# Patient Record
Sex: Female | Born: 1960 | Race: White | Hispanic: No | Smoking: Never smoker
Health system: Southern US, Community
[De-identification: ages and names within clinical notes are randomized; demographics above are authoritative.]

## PROBLEM LIST (undated history)

## (undated) DIAGNOSIS — Z9889 Other specified postprocedural states: Secondary | ICD-10-CM

## (undated) DIAGNOSIS — R112 Nausea with vomiting, unspecified: Secondary | ICD-10-CM

## (undated) DIAGNOSIS — R002 Palpitations: Secondary | ICD-10-CM

## (undated) DIAGNOSIS — I493 Ventricular premature depolarization: Secondary | ICD-10-CM

---

## 1988-05-12 HISTORY — PX: LEEP: SHX91

## 2002-03-15 ENCOUNTER — Other Ambulatory Visit: Admission: RE | Admit: 2002-03-15 | Discharge: 2002-03-15 | Payer: Self-pay | Admitting: Obstetrics and Gynecology

## 2003-03-17 ENCOUNTER — Other Ambulatory Visit: Admission: RE | Admit: 2003-03-17 | Discharge: 2003-03-17 | Payer: Self-pay | Admitting: Obstetrics and Gynecology

## 2004-03-27 ENCOUNTER — Other Ambulatory Visit: Admission: RE | Admit: 2004-03-27 | Discharge: 2004-03-27 | Payer: Self-pay | Admitting: Obstetrics and Gynecology

## 2004-10-31 ENCOUNTER — Other Ambulatory Visit: Admission: RE | Admit: 2004-10-31 | Discharge: 2004-10-31 | Payer: Self-pay | Admitting: Obstetrics and Gynecology

## 2005-03-28 ENCOUNTER — Other Ambulatory Visit: Admission: RE | Admit: 2005-03-28 | Discharge: 2005-03-28 | Payer: Self-pay | Admitting: Obstetrics and Gynecology

## 2010-05-12 HISTORY — PX: CHOLECYSTECTOMY: SHX55

## 2012-09-13 ENCOUNTER — Other Ambulatory Visit: Payer: Self-pay | Admitting: Obstetrics and Gynecology

## 2012-09-13 DIAGNOSIS — R928 Other abnormal and inconclusive findings on diagnostic imaging of breast: Secondary | ICD-10-CM

## 2012-09-22 ENCOUNTER — Ambulatory Visit
Admission: RE | Admit: 2012-09-22 | Discharge: 2012-09-22 | Disposition: A | Discharge status: Home / Self Care | Payer: BC Managed Care – PPO | Source: Ambulatory Visit | Attending: Obstetrics and Gynecology | Admitting: Obstetrics and Gynecology

## 2012-09-22 DIAGNOSIS — R928 Other abnormal and inconclusive findings on diagnostic imaging of breast: Secondary | ICD-10-CM

## 2014-11-10 ENCOUNTER — Other Ambulatory Visit: Payer: Self-pay | Admitting: Obstetrics and Gynecology

## 2014-11-10 DIAGNOSIS — R928 Other abnormal and inconclusive findings on diagnostic imaging of breast: Secondary | ICD-10-CM

## 2014-11-21 ENCOUNTER — Ambulatory Visit
Admission: RE | Admit: 2014-11-21 | Discharge: 2014-11-21 | Disposition: A | Discharge status: Home / Self Care | Payer: BLUE CROSS/BLUE SHIELD | Source: Ambulatory Visit | Attending: Obstetrics and Gynecology | Admitting: Obstetrics and Gynecology

## 2014-11-21 DIAGNOSIS — R928 Other abnormal and inconclusive findings on diagnostic imaging of breast: Secondary | ICD-10-CM

## 2015-09-05 DIAGNOSIS — D2262 Melanocytic nevi of left upper limb, including shoulder: Secondary | ICD-10-CM | POA: Diagnosis not present

## 2015-09-05 DIAGNOSIS — D2272 Melanocytic nevi of left lower limb, including hip: Secondary | ICD-10-CM | POA: Diagnosis not present

## 2015-09-05 DIAGNOSIS — D2239 Melanocytic nevi of other parts of face: Secondary | ICD-10-CM | POA: Diagnosis not present

## 2015-09-05 DIAGNOSIS — D2261 Melanocytic nevi of right upper limb, including shoulder: Secondary | ICD-10-CM | POA: Diagnosis not present

## 2015-09-05 DIAGNOSIS — D485 Neoplasm of uncertain behavior of skin: Secondary | ICD-10-CM | POA: Diagnosis not present

## 2015-09-05 DIAGNOSIS — Z85828 Personal history of other malignant neoplasm of skin: Secondary | ICD-10-CM | POA: Diagnosis not present

## 2015-10-10 DIAGNOSIS — Z01419 Encounter for gynecological examination (general) (routine) without abnormal findings: Secondary | ICD-10-CM | POA: Diagnosis not present

## 2015-10-10 DIAGNOSIS — N950 Postmenopausal bleeding: Secondary | ICD-10-CM | POA: Diagnosis not present

## 2016-01-23 DIAGNOSIS — Z6821 Body mass index (BMI) 21.0-21.9, adult: Secondary | ICD-10-CM | POA: Diagnosis not present

## 2016-01-23 DIAGNOSIS — N83209 Unspecified ovarian cyst, unspecified side: Secondary | ICD-10-CM | POA: Diagnosis not present

## 2016-01-23 DIAGNOSIS — Z1231 Encounter for screening mammogram for malignant neoplasm of breast: Secondary | ICD-10-CM | POA: Diagnosis not present

## 2016-01-23 DIAGNOSIS — D259 Leiomyoma of uterus, unspecified: Secondary | ICD-10-CM | POA: Diagnosis not present

## 2016-01-23 DIAGNOSIS — Z01419 Encounter for gynecological examination (general) (routine) without abnormal findings: Secondary | ICD-10-CM | POA: Diagnosis not present

## 2016-02-13 DIAGNOSIS — Z6825 Body mass index (BMI) 25.0-25.9, adult: Secondary | ICD-10-CM | POA: Diagnosis not present

## 2016-02-13 DIAGNOSIS — R002 Palpitations: Secondary | ICD-10-CM | POA: Diagnosis not present

## 2016-02-13 DIAGNOSIS — I493 Ventricular premature depolarization: Secondary | ICD-10-CM | POA: Diagnosis not present

## 2016-03-11 DIAGNOSIS — H524 Presbyopia: Secondary | ICD-10-CM | POA: Diagnosis not present

## 2016-10-16 DIAGNOSIS — Z6826 Body mass index (BMI) 26.0-26.9, adult: Secondary | ICD-10-CM | POA: Diagnosis not present

## 2016-10-16 DIAGNOSIS — F419 Anxiety disorder, unspecified: Secondary | ICD-10-CM | POA: Diagnosis not present

## 2016-10-16 DIAGNOSIS — E785 Hyperlipidemia, unspecified: Secondary | ICD-10-CM | POA: Diagnosis not present

## 2016-11-18 DIAGNOSIS — D224 Melanocytic nevi of scalp and neck: Secondary | ICD-10-CM | POA: Diagnosis not present

## 2016-11-18 DIAGNOSIS — Z85828 Personal history of other malignant neoplasm of skin: Secondary | ICD-10-CM | POA: Diagnosis not present

## 2016-11-18 DIAGNOSIS — L814 Other melanin hyperpigmentation: Secondary | ICD-10-CM | POA: Diagnosis not present

## 2016-11-18 DIAGNOSIS — D2261 Melanocytic nevi of right upper limb, including shoulder: Secondary | ICD-10-CM | POA: Diagnosis not present

## 2017-02-04 DIAGNOSIS — Z124 Encounter for screening for malignant neoplasm of cervix: Secondary | ICD-10-CM | POA: Diagnosis not present

## 2017-02-04 DIAGNOSIS — Z01419 Encounter for gynecological examination (general) (routine) without abnormal findings: Secondary | ICD-10-CM | POA: Diagnosis not present

## 2017-02-04 DIAGNOSIS — Z6821 Body mass index (BMI) 21.0-21.9, adult: Secondary | ICD-10-CM | POA: Diagnosis not present

## 2017-02-04 DIAGNOSIS — Z1231 Encounter for screening mammogram for malignant neoplasm of breast: Secondary | ICD-10-CM | POA: Diagnosis not present

## 2017-02-04 DIAGNOSIS — D259 Leiomyoma of uterus, unspecified: Secondary | ICD-10-CM | POA: Diagnosis not present

## 2017-03-24 DIAGNOSIS — H5213 Myopia, bilateral: Secondary | ICD-10-CM | POA: Diagnosis not present

## 2017-03-25 DIAGNOSIS — Z87891 Personal history of nicotine dependence: Secondary | ICD-10-CM | POA: Diagnosis not present

## 2017-03-25 DIAGNOSIS — R002 Palpitations: Secondary | ICD-10-CM | POA: Diagnosis not present

## 2017-03-25 DIAGNOSIS — I493 Ventricular premature depolarization: Secondary | ICD-10-CM | POA: Diagnosis not present

## 2017-03-27 ENCOUNTER — Other Ambulatory Visit: Payer: Self-pay

## 2018-03-04 DIAGNOSIS — N83201 Unspecified ovarian cyst, right side: Secondary | ICD-10-CM | POA: Diagnosis not present

## 2018-03-04 DIAGNOSIS — Z1231 Encounter for screening mammogram for malignant neoplasm of breast: Secondary | ICD-10-CM | POA: Diagnosis not present

## 2018-03-04 DIAGNOSIS — Z6824 Body mass index (BMI) 24.0-24.9, adult: Secondary | ICD-10-CM | POA: Diagnosis not present

## 2018-03-04 DIAGNOSIS — D259 Leiomyoma of uterus, unspecified: Secondary | ICD-10-CM | POA: Diagnosis not present

## 2018-03-04 DIAGNOSIS — Z113 Encounter for screening for infections with a predominantly sexual mode of transmission: Secondary | ICD-10-CM | POA: Diagnosis not present

## 2018-03-04 DIAGNOSIS — Z01419 Encounter for gynecological examination (general) (routine) without abnormal findings: Secondary | ICD-10-CM | POA: Diagnosis not present

## 2018-04-05 DIAGNOSIS — I493 Ventricular premature depolarization: Secondary | ICD-10-CM | POA: Diagnosis not present

## 2018-04-05 DIAGNOSIS — R002 Palpitations: Secondary | ICD-10-CM | POA: Diagnosis not present

## 2018-06-03 DIAGNOSIS — F419 Anxiety disorder, unspecified: Secondary | ICD-10-CM | POA: Diagnosis not present

## 2018-06-03 DIAGNOSIS — E785 Hyperlipidemia, unspecified: Secondary | ICD-10-CM | POA: Diagnosis not present

## 2018-06-03 DIAGNOSIS — Z23 Encounter for immunization: Secondary | ICD-10-CM | POA: Diagnosis not present

## 2018-06-03 DIAGNOSIS — M25531 Pain in right wrist: Secondary | ICD-10-CM | POA: Diagnosis not present

## 2018-06-07 DIAGNOSIS — M654 Radial styloid tenosynovitis [de Quervain]: Secondary | ICD-10-CM | POA: Diagnosis not present

## 2018-06-08 DIAGNOSIS — Z0000 Encounter for general adult medical examination without abnormal findings: Secondary | ICD-10-CM | POA: Diagnosis not present

## 2018-07-20 DIAGNOSIS — M65332 Trigger finger, left middle finger: Secondary | ICD-10-CM | POA: Diagnosis not present

## 2019-04-21 NOTE — Patient Instructions (Addendum)
DUE TO COVID-19 ONLY ONE VISITOR IS ALLOWED IN WAITING ROOM (VISITOR WILL HAVE A TEMPERATURE CHECK ON ARRIVAL AND MUST WEAR A FACE MASK THE ENTIRE TIME.)  ONCE YOU ARE ADMITTED TO YOUR PRIVATE ROOM, THE SAME ONE VISITOR IS ALLOWED TO VISIT DURING VISITING HOURS ONLY.  Your COVID swab testing is scheduled for 04/22/2019 at  2:20PM , You must self quarantine after your testing per handout given to you at the testing site.  ([place] up testing enter pre-surgical testing line)    Your procedure is scheduled on: 04/26/2019  Report to: [facility] AT: 10:00  A. M.   Call this number if you have problems the morning of surgery:[phone].   OUR ADDRESS IS [place].  WE ARE LOCATED IN THE [facility].                                     REMEMBER: DO NOT EAT FOOD OR DRINK LIQUIDS AFTER MIDNIGHT .    BRUSH YOUR TEETH THE MORNING OF SURGERY.  TAKE THESE MEDICATIONS MORNING OF SURGERY WITH A SIP OF WATER:  Carvedilol, Atorvastatin , Xyzal  , Flonase nasal spray if needed  DO NOT WEAR JEWERLY, MAKE UP, OR NAIL POLISH.  DO NOT WEAR LOTIONS, POWDERS, PERFUMES/COLOGNE OR DEODORANT.  DO NOT SHAVE FOR 24 HOURS PRIOR TO DAY OF SURGERY.    CONTACTS, GLASSES, OR DENTURES MAY NOT BE WORN TO SURGERY.                                    [facility] IS NOT RESPONSIBLE  FOR ANY BELONGINGS.           BRING ALL PRESCRIPTION MEDICATIONS WITH YOU THE DAY OF SURGERY IN ORIGINAL CONTAINERS                                                                    .[facility] - Preparing for Surgery Before surgery, you can play an important role.  Because skin is not sterile, your skin needs to be as free of germs as possible.  You can reduce the number of germs on your skin by washing with CHG (chlorahexidine gluconate) soap before surgery.  CHG is an antiseptic cleaner which kills germs and bonds with the skin to  continue killing germs even after washing. Please DO NOT use if you have an allergy to CHG or antibacterial soaps.  If your skin becomes reddened/irritated stop using the CHG and inform your nurse when you arrive at Short Stay. Do not shave (including legs and underarms) for at least 48 hours prior to the first CHG shower.  You may shave your face/neck. Please follow these instructions carefully:  1.  Shower with CHG Soap the night before surgery and the  morning of Surgery.  2.  If you choose to wash your hair, wash your hair first as usual with your  normal  shampoo.  3.  After you shampoo, rinse your hair and body thoroughly to remove the  shampoo.  4.  Use CHG as you would any other liquid soap.  You can apply chg directly  to the skin and wash                       Gently with a scrungie or clean washcloth.  5.  Apply the CHG Soap to your body ONLY FROM THE NECK DOWN.   Do not use on face/ open                           Wound or open sores. Avoid contact with eyes, ears mouth and genitals (private parts).                       Wash face,  Genitals (private parts) with your normal soap.             6.  Wash thoroughly, paying special attention to the area where your surgery  will be performed.  7.  Thoroughly rinse your body with warm water from the neck down.  8.  DO NOT shower/wash with your normal soap after using and rinsing off  the CHG Soap.                9.  Pat yourself dry with a clean towel.            10.  Wear clean pajamas.            11.  Place clean sheets on your bed the night of your first shower and do not  sleep with pets. Day of Surgery : Do not apply any lotions/deodorants the morning of surgery.  Please wear clean clothes to the hospital/surgery center.  FAILURE TO FOLLOW THESE INSTRUCTIONS MAY RESULT IN THE CANCELLATION OF YOUR SURGERY PATIENT SIGNATURE_________________________________  NURSE  SIGNATURE__________________________________  ________________________________________________________________________   [facility] - Preparing for Surgery Before surgery, you can play an important role.  Because skin is not sterile, your skin needs to be as free of germs as possible.  You can reduce the number of germs on your skin by washing with CHG (chlorahexidine gluconate) soap before surgery.  CHG is an antiseptic cleaner which kills germs and bonds with the skin to continue killing germs even after washing. Please DO NOT use if you have an allergy to CHG or antibacterial soaps.  If your skin becomes reddened/irritated stop using the CHG and inform your nurse when you arrive at Short Stay. Do not shave (including legs and underarms) for at least 48 hours prior to the first CHG shower.  You may shave your face/neck. Please follow these instructions carefully:  1.  Shower with CHG Soap the night before surgery and the  morning of Surgery.  2.  If you choose to wash your hair, wash your hair first as usual with your  normal  shampoo.  3.  After you shampoo, rinse your hair and body thoroughly to remove the  shampoo.                           4.  Use CHG as you would any other liquid soap.  You can apply chg directly  to the skin and wash                       Gently with a scrungie or clean washcloth.  5.  Apply  the CHG Soap to your body ONLY FROM THE NECK DOWN.   Do not use on face/ open                           Wound or open sores. Avoid contact with eyes, ears mouth and genitals (private parts).                       Wash face,  Genitals (private parts) with your normal soap.             6.  Wash thoroughly, paying special attention to the area where your surgery  will be performed.  7.  Thoroughly rinse your body with warm water from the neck down.  8.  DO NOT shower/wash with your normal soap after using and rinsing off  the CHG Soap.                9.  Pat yourself dry with a clean  towel.            10.  Wear clean pajamas.            11.  Place clean sheets on your bed the night of your first shower and do not  sleep with pets. Day of Surgery : Do not apply any lotions/deodorants the morning of surgery.  Please wear clean clothes to the hospital/surgery center.  FAILURE TO FOLLOW THESE INSTRUCTIONS MAY RESULT IN THE CANCELLATION OF YOUR SURGERY PATIENT SIGNATURE_________________________________  NURSE SIGNATURE__________________________________  ________________________________________________________________________

## 2019-04-21 NOTE — Progress Notes (Signed)
Can you please place orders on Epic,pt. Has her PST appointment on 04/22/2019. Thank you

## 2019-04-22 ENCOUNTER — Encounter (HOSPITAL_COMMUNITY)
Admission: RE | Admit: 2019-04-22 | Discharge: 2019-04-22 | Disposition: A | Discharge status: Home / Self Care | Payer: Managed Care, Other (non HMO) | Source: Ambulatory Visit | Attending: Obstetrics | Admitting: Obstetrics

## 2019-04-22 ENCOUNTER — Other Ambulatory Visit: Payer: Self-pay

## 2019-04-22 ENCOUNTER — Encounter (HOSPITAL_COMMUNITY): Payer: Self-pay

## 2019-04-22 ENCOUNTER — Other Ambulatory Visit (HOSPITAL_COMMUNITY)
Admission: RE | Admit: 2019-04-22 | Discharge: 2019-04-22 | Disposition: A | Discharge status: Home / Self Care | Payer: Managed Care, Other (non HMO) | Source: Ambulatory Visit | Attending: Obstetrics | Admitting: Obstetrics

## 2019-04-22 DIAGNOSIS — Z20828 Contact with and (suspected) exposure to other viral communicable diseases: Secondary | ICD-10-CM | POA: Diagnosis not present

## 2019-04-22 DIAGNOSIS — D259 Leiomyoma of uterus, unspecified: Secondary | ICD-10-CM | POA: Insufficient documentation

## 2019-04-22 DIAGNOSIS — Z01818 Encounter for other preprocedural examination: Secondary | ICD-10-CM | POA: Diagnosis not present

## 2019-04-22 DIAGNOSIS — N83201 Unspecified ovarian cyst, right side: Secondary | ICD-10-CM | POA: Insufficient documentation

## 2019-04-22 DIAGNOSIS — R001 Bradycardia, unspecified: Secondary | ICD-10-CM | POA: Insufficient documentation

## 2019-04-22 DIAGNOSIS — R9431 Abnormal electrocardiogram [ECG] [EKG]: Secondary | ICD-10-CM | POA: Insufficient documentation

## 2019-04-22 HISTORY — DX: Other specified postprocedural states: Z98.890

## 2019-04-22 HISTORY — DX: Ventricular premature depolarization: I49.3

## 2019-04-22 HISTORY — DX: Nausea with vomiting, unspecified: R11.2

## 2019-04-22 HISTORY — DX: Palpitations: R00.2

## 2019-04-22 LAB — CBC
HCT: 43.5 % (ref 36.0–46.0)
Hemoglobin: 14.4 g/dL (ref 12.0–15.0)
MCH: 31.3 pg (ref 26.0–34.0)
MCHC: 33.1 g/dL (ref 30.0–36.0)
MCV: 94.6 fL (ref 80.0–100.0)
Platelets: 226 10*3/uL (ref 150–400)
RBC: 4.6 MIL/uL (ref 3.87–5.11)
RDW: 11.7 % (ref 11.5–15.5)
WBC: 6.9 10*3/uL (ref 4.0–10.5)
nRBC: 0 % (ref 0.0–0.2)

## 2019-04-22 LAB — BASIC METABOLIC PANEL
Anion gap: 10 (ref 5–15)
BUN: 10 mg/dL (ref 6–20)
CO2: 25 mmol/L (ref 22–32)
Calcium: 9.7 mg/dL (ref 8.9–10.3)
Chloride: 108 mmol/L (ref 98–111)
Creatinine, Ser: 0.61 mg/dL (ref 0.44–1.00)
GFR calc Af Amer: >60 mL/min (ref >60)
GFR calc non Af Amer: >60 mL/min (ref >60)
Glucose, Bld: 111 mg/dL — ABNORMAL HIGH (ref 70–99)
Potassium: 3.9 mmol/L (ref 3.5–5.1)
Sodium: 143 mmol/L (ref 135–145)

## 2019-04-22 NOTE — Progress Notes (Signed)
PCP - Nicoletta Dress, MD Cardiologist - Mar Daring, MD lov/cardiac clearance 03-25-2019 [facility]    Chest x-ray -  EKG - 04-22-2019 epic  Stress Test -  ECHO -  Cardiac Cath -   Sleep Study -  CPAP -   Fasting Blood Sugar -  Checks Blood Sugar _____ times a day  Blood Thinner Instructions: Aspirin Instructions: Last Dose:  Anesthesia review:    Hx of palpitations and PVCS. Patient reports good control of palpitations with her carvedilol with palpitations occurring very infrequently and usually only under circumstances of increased stress. Denies any associated chest pain or sob. Vitals wdl today. ekg obtained today . she follows up with cardiology annually. lov Dr Otho Perl 03-25-2019 for pre-op exam    Patient denies shortness of breath, fever, cough and chest pain at PAT appointment   Patient verbalized understanding of instructions that were given to them at the PAT appointment. Patient was also instructed that they will need to review over the PAT instructions again at home before surgery.

## 2019-04-23 LAB — NOVEL CORONAVIRUS, NAA (HOSP ORDER, SEND-OUT TO REF LAB; TAT 18-24 HRS): SARS-CoV-2, NAA: NOT DETECTED

## 2019-04-25 NOTE — Anesthesia Preprocedure Evaluation (Addendum)
Anesthesia Evaluation  Patient identified by MRN, date of birth, ID band Patient awake    Reviewed: Allergy & Precautions, NPO status , Patient's Chart, lab work & pertinent test results, reviewed documented beta blocker date and time   History of Anesthesia Complications (+) PONV and history of anesthetic complications  Airway Mallampati: II  TM Distance: >3 FB Neck ROM: Full    Dental no notable dental hx.    Pulmonary neg pulmonary ROS,    Pulmonary exam normal breath sounds clear to auscultation       Cardiovascular Normal cardiovascular exam+ dysrhythmias  Rhythm:Regular Rate:Normal  Palpitations/PVCs since 2011, cleared by cardiology for this procedure- on coreg   Neuro/Psych PSYCHIATRIC DISORDERS Anxiety negative neurological ROS     GI/Hepatic negative GI ROS, Neg liver ROS,   Endo/Other  negative endocrine ROS  Renal/GU negative Renal ROS   Uterine fibroid, ovarian cyst    Musculoskeletal negative musculoskeletal ROS (+)   Abdominal   Peds  Hematology negative hematology ROS (+)   Anesthesia Other Findings HLD  Reproductive/Obstetrics negative OB ROS                            Anesthesia Physical Anesthesia Plan  ASA: II  Anesthesia Plan: General   Post-op Pain Management:    Induction: Intravenous  PONV Risk Score and Plan: 4 or greater and Ondansetron, Dexamethasone, Propofol infusion, Midazolam, Scopolamine patch - Pre-op and Metaclopromide  Airway Management Planned: Oral ETT  Additional Equipment: None  Intra-op Plan:   Post-operative Plan: Extubation in OR  Informed Consent: I have reviewed the patients History and Physical, chart, labs and discussed the procedure including the risks, benefits and alternatives for the proposed anesthesia with the patient or authorized representative who has indicated his/her understanding and acceptance.     Dental  advisory given  Plan Discussed with: CRNA  Anesthesia Plan Comments:        Anesthesia Quick Evaluation

## 2019-04-26 ENCOUNTER — Encounter (HOSPITAL_BASED_OUTPATIENT_CLINIC_OR_DEPARTMENT_OTHER): Payer: Self-pay | Admitting: Obstetrics

## 2019-04-26 ENCOUNTER — Encounter (HOSPITAL_BASED_OUTPATIENT_CLINIC_OR_DEPARTMENT_OTHER)
Admission: RE | Disposition: A | Discharge status: Home / Self Care | Payer: Self-pay | Source: Home / Self Care | Attending: Obstetrics

## 2019-04-26 ENCOUNTER — Observation Stay (HOSPITAL_BASED_OUTPATIENT_CLINIC_OR_DEPARTMENT_OTHER): Payer: Managed Care, Other (non HMO) | Admitting: Anesthesiology

## 2019-04-26 ENCOUNTER — Other Ambulatory Visit: Payer: Self-pay

## 2019-04-26 ENCOUNTER — Observation Stay (HOSPITAL_BASED_OUTPATIENT_CLINIC_OR_DEPARTMENT_OTHER)
Admission: RE | Admit: 2019-04-26 | Discharge: 2019-04-27 | Disposition: A | Discharge status: Home / Self Care | Payer: Managed Care, Other (non HMO) | Attending: Obstetrics | Admitting: Obstetrics

## 2019-04-26 ENCOUNTER — Observation Stay (HOSPITAL_BASED_OUTPATIENT_CLINIC_OR_DEPARTMENT_OTHER): Payer: Managed Care, Other (non HMO) | Admitting: Physician Assistant

## 2019-04-26 DIAGNOSIS — D270 Benign neoplasm of right ovary: Principal | ICD-10-CM | POA: Insufficient documentation

## 2019-04-26 DIAGNOSIS — R102 Pelvic and perineal pain: Secondary | ICD-10-CM | POA: Diagnosis present

## 2019-04-26 DIAGNOSIS — D251 Intramural leiomyoma of uterus: Secondary | ICD-10-CM | POA: Insufficient documentation

## 2019-04-26 DIAGNOSIS — Z90710 Acquired absence of both cervix and uterus: Secondary | ICD-10-CM

## 2019-04-26 DIAGNOSIS — N838 Other noninflammatory disorders of ovary, fallopian tube and broad ligament: Secondary | ICD-10-CM | POA: Diagnosis not present

## 2019-04-26 DIAGNOSIS — N800 Endometriosis of uterus: Secondary | ICD-10-CM | POA: Diagnosis not present

## 2019-04-26 DIAGNOSIS — Z79899 Other long term (current) drug therapy: Secondary | ICD-10-CM | POA: Insufficient documentation

## 2019-04-26 DIAGNOSIS — N83209 Unspecified ovarian cyst, unspecified side: Secondary | ICD-10-CM | POA: Diagnosis present

## 2019-04-26 HISTORY — PX: LAPAROSCOPIC VAGINAL HYSTERECTOMY WITH SALPINGO OOPHORECTOMY: SHX6681

## 2019-04-26 LAB — TYPE AND SCREEN
ABO/RH(D): A POS
Antibody Screen: NEGATIVE

## 2019-04-26 LAB — ABO/RH: ABO/RH(D): A POS

## 2019-04-26 SURGERY — HYSTERECTOMY, VAGINAL, LAPAROSCOPY-ASSISTED, WITH SALPINGO-OOPHORECTOMY
Anesthesia: General | Site: Abdomen

## 2019-04-26 MED ORDER — OXYCODONE HCL 5 MG PO TABS
5.0000 mg | ORAL_TABLET | Freq: Once | ORAL | Status: DC | PRN
  Filled 2019-04-26: qty 1

## 2019-04-26 MED ORDER — ACETAMINOPHEN 500 MG PO TABS
1000.0000 mg | ORAL_TABLET | ORAL | Status: AC
  Administered 2019-04-26: 1000 mg via ORAL
  Filled 2019-04-26: qty 2

## 2019-04-26 MED ORDER — SCOPOLAMINE 1 MG/3DAYS TD PT72
MEDICATED_PATCH | TRANSDERMAL | Status: AC
  Filled 2019-04-26: qty 1

## 2019-04-26 MED ORDER — SODIUM CHLORIDE 0.9 % IR SOLN
Status: DC | PRN
  Administered 2019-04-26: 150 mL

## 2019-04-26 MED ORDER — LIDOCAINE 2% (20 MG/ML) 5 ML SYRINGE
INTRAMUSCULAR | Status: DC | PRN
  Administered 2019-04-26: 40 mg via INTRAVENOUS
  Administered 2019-04-26: 60 mg via INTRAVENOUS

## 2019-04-26 MED ORDER — MIDAZOLAM HCL 2 MG/2ML IJ SOLN
INTRAMUSCULAR | Status: DC | PRN
  Administered 2019-04-26: 2 mg via INTRAVENOUS

## 2019-04-26 MED ORDER — DEXAMETHASONE SODIUM PHOSPHATE 10 MG/ML IJ SOLN
INTRAMUSCULAR | Status: AC
  Filled 2019-04-26: qty 1

## 2019-04-26 MED ORDER — BUPIVACAINE HCL (PF) 0.25 % IJ SOLN
INTRAMUSCULAR | Status: DC | PRN
  Administered 2019-04-26: 8 mL

## 2019-04-26 MED ORDER — SCOPOLAMINE 1 MG/3DAYS TD PT72
1.0000 | MEDICATED_PATCH | TRANSDERMAL | Status: DC
  Administered 2019-04-26: 1.5 mg via TRANSDERMAL
  Filled 2019-04-26: qty 1

## 2019-04-26 MED ORDER — CEFAZOLIN SODIUM-DEXTROSE 2-4 GM/100ML-% IV SOLN
INTRAVENOUS | Status: AC
  Filled 2019-04-26: qty 100

## 2019-04-26 MED ORDER — FENTANYL CITRATE (PF) 250 MCG/5ML IJ SOLN
INTRAMUSCULAR | Status: AC
  Filled 2019-04-26: qty 5

## 2019-04-26 MED ORDER — ONDANSETRON HCL 4 MG/2ML IJ SOLN
4.0000 mg | Freq: Four times a day (QID) | INTRAMUSCULAR | Status: DC | PRN
  Filled 2019-04-26: qty 2

## 2019-04-26 MED ORDER — METOCLOPRAMIDE HCL 5 MG/ML IJ SOLN
INTRAMUSCULAR | Status: AC
  Filled 2019-04-26: qty 2

## 2019-04-26 MED ORDER — ACETAMINOPHEN 500 MG PO TABS
ORAL_TABLET | ORAL | Status: AC
  Filled 2019-04-26: qty 2

## 2019-04-26 MED ORDER — CEFAZOLIN SODIUM-DEXTROSE 2-4 GM/100ML-% IV SOLN
2.0000 g | INTRAVENOUS | Status: AC
  Administered 2019-04-26: 2 g via INTRAVENOUS
  Filled 2019-04-26: qty 100

## 2019-04-26 MED ORDER — LACTATED RINGERS IV SOLN
INTRAVENOUS | Status: DC
  Filled 2019-04-26: qty 1000

## 2019-04-26 MED ORDER — FENTANYL CITRATE (PF) 100 MCG/2ML IJ SOLN
INTRAMUSCULAR | Status: DC | PRN
  Administered 2019-04-26 (×5): 50 ug via INTRAVENOUS

## 2019-04-26 MED ORDER — OXYCODONE HCL 5 MG PO TABS
5.0000 mg | ORAL_TABLET | ORAL | Status: DC | PRN
  Filled 2019-04-26: qty 2

## 2019-04-26 MED ORDER — PROPOFOL 10 MG/ML IV BOLUS
INTRAVENOUS | Status: AC
  Filled 2019-04-26: qty 20

## 2019-04-26 MED ORDER — IBUPROFEN 800 MG PO TABS
800.0000 mg | ORAL_TABLET | Freq: Three times a day (TID) | ORAL | Status: DC
  Administered 2019-04-26 – 2019-04-27 (×2): 800 mg via ORAL
  Filled 2019-04-26: qty 1

## 2019-04-26 MED ORDER — MEPERIDINE HCL 25 MG/ML IJ SOLN
6.2500 mg | INTRAMUSCULAR | Status: DC | PRN
  Filled 2019-04-26: qty 1

## 2019-04-26 MED ORDER — ONDANSETRON HCL 4 MG/2ML IJ SOLN
INTRAMUSCULAR | Status: AC
  Filled 2019-04-26: qty 2

## 2019-04-26 MED ORDER — DEXAMETHASONE SODIUM PHOSPHATE 10 MG/ML IJ SOLN
INTRAMUSCULAR | Status: DC | PRN
  Administered 2019-04-26: 8 mg via INTRAVENOUS

## 2019-04-26 MED ORDER — GABAPENTIN 300 MG PO CAPS
ORAL_CAPSULE | ORAL | Status: AC
  Filled 2019-04-26: qty 1

## 2019-04-26 MED ORDER — ONDANSETRON HCL 4 MG PO TABS
4.0000 mg | ORAL_TABLET | Freq: Four times a day (QID) | ORAL | Status: DC | PRN
  Filled 2019-04-26: qty 1

## 2019-04-26 MED ORDER — GLYCOPYRROLATE 0.2 MG/ML IJ SOLN
INTRAMUSCULAR | Status: DC | PRN
  Administered 2019-04-26: .2 mg via INTRAVENOUS

## 2019-04-26 MED ORDER — GABAPENTIN 300 MG PO CAPS
300.0000 mg | ORAL_CAPSULE | ORAL | Status: AC
  Administered 2019-04-26: 11:00:00 300 mg via ORAL
  Filled 2019-04-26: qty 1

## 2019-04-26 MED ORDER — KETOROLAC TROMETHAMINE 30 MG/ML IJ SOLN
30.0000 mg | Freq: Once | INTRAMUSCULAR | Status: AC | PRN
  Filled 2019-04-26: qty 1

## 2019-04-26 MED ORDER — EPHEDRINE 5 MG/ML INJ
INTRAVENOUS | Status: AC
  Filled 2019-04-26: qty 10

## 2019-04-26 MED ORDER — PROPOFOL 10 MG/ML IV BOLUS
INTRAVENOUS | Status: DC | PRN
  Administered 2019-04-26: 150 mg via INTRAVENOUS
  Administered 2019-04-26: 30 mg via INTRAVENOUS

## 2019-04-26 MED ORDER — SODIUM CHLORIDE (PF) 0.9 % IJ SOLN
INTRAMUSCULAR | Status: DC | PRN
  Administered 2019-04-26: 100 mL

## 2019-04-26 MED ORDER — HYDROMORPHONE HCL 1 MG/ML IJ SOLN
0.2500 mg | INTRAMUSCULAR | Status: DC | PRN
  Filled 2019-04-26: qty 0.5

## 2019-04-26 MED ORDER — KETOROLAC TROMETHAMINE 30 MG/ML IJ SOLN
INTRAMUSCULAR | Status: AC
  Filled 2019-04-26: qty 1

## 2019-04-26 MED ORDER — PROMETHAZINE HCL 25 MG/ML IJ SOLN
6.2500 mg | INTRAMUSCULAR | Status: DC | PRN
  Filled 2019-04-26: qty 1

## 2019-04-26 MED ORDER — EPHEDRINE SULFATE-NACL 50-0.9 MG/10ML-% IV SOSY
PREFILLED_SYRINGE | INTRAVENOUS | Status: DC | PRN
  Administered 2019-04-26: 5 mg via INTRAVENOUS
  Administered 2019-04-26: 15 mg via INTRAVENOUS
  Administered 2019-04-26: 10 mg via INTRAVENOUS
  Administered 2019-04-26: 15 mg via INTRAVENOUS

## 2019-04-26 MED ORDER — ROCURONIUM BROMIDE 10 MG/ML (PF) SYRINGE
PREFILLED_SYRINGE | INTRAVENOUS | Status: AC
  Filled 2019-04-26: qty 10

## 2019-04-26 MED ORDER — LIDOCAINE 2% (20 MG/ML) 5 ML SYRINGE
INTRAMUSCULAR | Status: AC
  Filled 2019-04-26: qty 5

## 2019-04-26 MED ORDER — MENTHOL 3 MG MT LOZG
1.0000 | LOZENGE | OROMUCOSAL | Status: DC | PRN
  Filled 2019-04-26: qty 9

## 2019-04-26 MED ORDER — ACETAMINOPHEN 500 MG PO TABS
1000.0000 mg | ORAL_TABLET | Freq: Four times a day (QID) | ORAL | Status: DC
  Administered 2019-04-26 – 2019-04-27 (×3): 1000 mg via ORAL
  Filled 2019-04-26: qty 2

## 2019-04-26 MED ORDER — METOCLOPRAMIDE HCL 5 MG/ML IJ SOLN
INTRAMUSCULAR | Status: DC | PRN
  Administered 2019-04-26 (×2): 5 mg via INTRAVENOUS

## 2019-04-26 MED ORDER — KETOROLAC TROMETHAMINE 15 MG/ML IJ SOLN
15.0000 mg | INTRAMUSCULAR | Status: AC
  Administered 2019-04-26: 30 mg via INTRAVENOUS
  Filled 2019-04-26: qty 1

## 2019-04-26 MED ORDER — OXYCODONE HCL 5 MG/5ML PO SOLN
5.0000 mg | Freq: Once | ORAL | Status: DC | PRN
  Filled 2019-04-26: qty 5

## 2019-04-26 MED ORDER — VASOPRESSIN 20 UNIT/ML IV SOLN
INTRAVENOUS | Status: DC | PRN
  Administered 2019-04-26: 20 [IU] via SUBCUTANEOUS

## 2019-04-26 MED ORDER — ROCURONIUM BROMIDE 10 MG/ML (PF) SYRINGE
PREFILLED_SYRINGE | INTRAVENOUS | Status: DC | PRN
  Administered 2019-04-26 (×2): 10 mg via INTRAVENOUS
  Administered 2019-04-26: 50 mg via INTRAVENOUS
  Administered 2019-04-26 (×2): 10 mg via INTRAVENOUS

## 2019-04-26 MED ORDER — ONDANSETRON HCL 4 MG/2ML IJ SOLN
INTRAMUSCULAR | Status: DC | PRN
  Administered 2019-04-26 (×2): 4 mg via INTRAVENOUS

## 2019-04-26 MED ORDER — SUGAMMADEX SODIUM 200 MG/2ML IV SOLN
INTRAVENOUS | Status: DC | PRN
  Administered 2019-04-26: 200 mg via INTRAVENOUS

## 2019-04-26 MED ORDER — CARVEDILOL 3.125 MG PO TABS
3.1250 mg | ORAL_TABLET | Freq: Two times a day (BID) | ORAL | Status: DC
  Administered 2019-04-27: 08:00:00 3.125 mg via ORAL
  Filled 2019-04-26: qty 1

## 2019-04-26 MED ORDER — IBUPROFEN 800 MG PO TABS
ORAL_TABLET | ORAL | Status: AC
  Filled 2019-04-26: qty 1

## 2019-04-26 MED ORDER — GLYCOPYRROLATE PF 0.2 MG/ML IJ SOSY
PREFILLED_SYRINGE | INTRAMUSCULAR | Status: AC
  Filled 2019-04-26: qty 1

## 2019-04-26 MED ORDER — ACETAMINOPHEN 500 MG PO TABS
1000.0000 mg | ORAL_TABLET | Freq: Once | ORAL | Status: DC
  Filled 2019-04-26: qty 2

## 2019-04-26 MED ORDER — HYDROMORPHONE HCL 1 MG/ML IJ SOLN
INTRAMUSCULAR | Status: AC
  Filled 2019-04-26: qty 1

## 2019-04-26 MED ORDER — MIDAZOLAM HCL 2 MG/2ML IJ SOLN
INTRAMUSCULAR | Status: AC
  Filled 2019-04-26: qty 2

## 2019-04-26 SURGICAL SUPPLY — 58 items
ADH SKN CLS APL DERMABOND .7 (GAUZE/BANDAGES/DRESSINGS) ×1
BAG SPEC RTRVL LRG 6X4 10 (ENDOMECHANICALS) ×1
BLADE 10 SAFETY STRL DISP (BLADE) ×1 IMPLANT
CATH ROBINSON RED A/P 16FR (CATHETERS) ×1 IMPLANT
CONT SPEC 4OZ CLIKSEAL STRL BL (MISCELLANEOUS) ×1 IMPLANT
COVER BACK TABLE 60X90IN (DRAPES) ×2 IMPLANT
COVER MAYO STAND STRL (DRAPES) ×4 IMPLANT
COVER WAND RF STERILE (DRAPES) ×2 IMPLANT
DECANTER SPIKE VIAL GLASS SM (MISCELLANEOUS) ×4 IMPLANT
DERMABOND ADVANCED (GAUZE/BANDAGES/DRESSINGS) ×1
DERMABOND ADVANCED .7 DNX12 (GAUZE/BANDAGES/DRESSINGS) IMPLANT
DRSG OPSITE POSTOP 3X4 (GAUZE/BANDAGES/DRESSINGS) ×1 IMPLANT
DURAPREP 26ML APPLICATOR (WOUND CARE) ×2 IMPLANT
ELECT REM PT RETURN 9FT ADLT (ELECTROSURGICAL) ×2
ELECTRODE REM PT RTRN 9FT ADLT (ELECTROSURGICAL) ×1 IMPLANT
GAUZE 4X4 16PLY RFD (DISPOSABLE) ×2 IMPLANT
GLOVE BIOGEL PI IND STRL 6.5 (GLOVE) ×3 IMPLANT
GLOVE BIOGEL PI IND STRL 7.0 (GLOVE) ×2 IMPLANT
GLOVE BIOGEL PI INDICATOR 6.5 (GLOVE) ×3
GLOVE BIOGEL PI INDICATOR 7.0 (GLOVE) ×2
GLOVE ECLIPSE 6.0 STRL STRAW (GLOVE) ×4 IMPLANT
HOLDER FOLEY CATH W/STRAP (MISCELLANEOUS) ×1 IMPLANT
IV NS IRRIG 3000ML ARTHROMATIC (IV SOLUTION) ×1 IMPLANT
LIGASURE VESSEL 5MM BLUNT TIP (ELECTROSURGICAL) ×1 IMPLANT
MANIPULATOR ADVINCU DEL 2.5 PL (MISCELLANEOUS) ×1 IMPLANT
NS IRRIG 1000ML POUR BTL (IV SOLUTION) ×2 IMPLANT
PACK LAVH (CUSTOM PROCEDURE TRAY) ×2 IMPLANT
PACK ROBOTIC GOWN (GOWN DISPOSABLE) ×2 IMPLANT
PACK TRENDGUARD 450 HYBRID PRO (MISCELLANEOUS) IMPLANT
POUCH SPECIMEN RETRIEVAL 10MM (ENDOMECHANICALS) ×1 IMPLANT
PROTECTOR NERVE ULNAR (MISCELLANEOUS) ×2 IMPLANT
SET IRRIG TUBING LAPAROSCOPIC (IRRIGATION / IRRIGATOR) IMPLANT
SET IRRIG Y TYPE TUR BLADDER L (SET/KITS/TRAYS/PACK) IMPLANT
SET TUBE SMOKE EVAC HIGH FLOW (TUBING) ×1 IMPLANT
SLEEVE XCEL OPT CAN 5 100 (ENDOMECHANICALS) IMPLANT
SPONGE LAP 18X18 RF (DISPOSABLE) ×2 IMPLANT
SPONGE LAP 4X18 RFD (DISPOSABLE) ×1 IMPLANT
SUT VIC AB 0 CT1 18XCR BRD8 (SUTURE) ×2 IMPLANT
SUT VIC AB 0 CT1 27 (SUTURE) ×2
SUT VIC AB 0 CT1 27XBRD ANBCTR (SUTURE) ×1 IMPLANT
SUT VIC AB 0 CT1 8-18 (SUTURE) ×4
SUT VIC AB 2-0 CT1 (SUTURE) ×1 IMPLANT
SUT VIC AB 2-0 CT1 27 (SUTURE)
SUT VIC AB 2-0 CT1 TAPERPNT 27 (SUTURE) IMPLANT
SUT VIC AB 2-0 SH 27 (SUTURE)
SUT VIC AB 2-0 SH 27XBRD (SUTURE) IMPLANT
SUT VICRYL 0 TIES 12 18 (SUTURE) ×2 IMPLANT
SUT VICRYL 0 UR6 27IN ABS (SUTURE) ×1 IMPLANT
SUT VICRYL RAPIDE 4/0 PS 2 (SUTURE) ×2 IMPLANT
SYR 50ML LL SCALE MARK (SYRINGE) ×1 IMPLANT
SYR 5ML LL (SYRINGE) ×1 IMPLANT
SYR BULB IRRIGATION 50ML (SYRINGE) ×1 IMPLANT
TOWEL OR 17X26 10 PK STRL BLUE (TOWEL DISPOSABLE) ×4 IMPLANT
TRAY FOLEY W/BAG SLVR 14FR (SET/KITS/TRAYS/PACK) ×2 IMPLANT
TRENDGUARD 450 HYBRID PRO PACK (MISCELLANEOUS) ×2
TROCAR XCEL NON-BLD 11X100MML (ENDOMECHANICALS) IMPLANT
TROCAR XCEL NON-BLD 5MMX100MML (ENDOMECHANICALS) ×2 IMPLANT
WARMER LAPAROSCOPE (MISCELLANEOUS) ×2 IMPLANT

## 2019-04-26 NOTE — Op Note (Signed)
Pre-Operative Diagnosis: 1. Pelvic pressure  2. Right ovarian cyst  3.  Fibroid uterus  Postoperative Diagnosis: 1. Pelvic pressure  2. Right ovarian cyst  3.  Fibroid uterus  Procedure: Laparoscopic assisted vaginal hysterectomy, bilateral salpingectomy, right oophorectomy  Surgeon:  Jerelyn Charles, MD  Assistant: Freda Munro, MD  Anesthesia: General endotracheal anesthesia, 10 cc of 0.25% Marcaine injected infraumbilically and 10 cc of dilute pitressin  Estimated blood loss:  250 mL   Operative Findings:  Enlarged fibroid uterus, 12 week size, 9 cm simple right ovarian cyst  Specimen: Uterus, cervix, bilateral tubes, right ovary and cyst.  Cyst fluid for cytology  Description of the Procedure:  The patient was taken to the operating room, where general endotracheal anesthesia was obtained without difficulty. She was placed in the dorsal lithotomy position in [place] and exam under anesthesia was performed.  An irregular fibroid, mobile uterus was appreciated  There was also a mobile right adnexal mass. The patient was prepped and draped in the normal sterile fashion. A bivalve speculum was placed into the vagina an advincula uterine manipulator was placed after cervical dilation with hagar dilators without difficulty. Attention was then turned to the abdomen. The scalp was used to make a 10 mm vertical incision at the upper edge of the umbilicus. The 10/11 mm Optiview trocar was used to enter the abdomen under direct visualization. Entry was confirmed and the abdomen was insufflated with carbon dioxide. Initial entry was noted to be through the omentum.  Under direct visualization, two additional 5-mm ports were placed into the right and left lower quadrant. The upper abdomen was surveyed.  The trocar was freed of the omentum.  There was noted to be a small omental defect with minimal bleeding.  There was no injury to bowel appreciated either during entry or upon further inspection of the  upper abdomen.  The uterus was elevated out of the pelvis.  It was noted that the advincula uterine manipulator had perforated the fundus approximated, but active bleeding was minimal.   The right ovary was enlarged to approximately 9 to 10 cm.  The appearance was consistent with a simple cyst.  The right ureter was identified and well below the level of the infundibulopelvic ligament.  Due to the size, the was challenging to elevate the ovary to the midline to gain clear access to the IP.  Given the simple appearance, the decision was made to drain the cyst to allow better exposure of the IP.  A needle was introduced through the trocar and used to drain the cyst fluid through the abdomen with a large syringe.  Copious clear serous fluid was removed through the abdomen and sent for cytology.  There was a small amount of drainage of cyst fluid in the abdomen.  After the cyst was deflated, the LigaSure was used to clamp, doubly burn and cut the IP, followed by the mesosapinx to the level of the cornua.  The right fallopian tube was transected at the level of the cornua.  At this time, the right tube and ovary were placed in an endocatch bag and removed through the umbilicus to allow better visualization of the uterus.    Attention was turned to the left side.  The left ovary was small and normal in appearance.  The left ureter was identified and low in the pelvis.  The left fallopian tube was grasped and the LigaSure was used to sequentially clamp, cut, and burn the mesosalpinx to the level of  the cornua. The round ligament and utero-ovarian were then taken in a similar fashion. The anterior leaf of the broad ligament was developed with the LigaSure and dissected down to the level of the bladder. The posterior leaf was taken in a similar manner. The bladder flap was developed to the midline.  The left uterine artery was clamped, doubly burned and cut.  Attention was then turned to the right side.  The round  ligament was clamped, cut and burned with the LigaSure.   The broad ligament down to level of the uterine arteries. Due to the fibroids on the right side, the right uterine artery was not well visualized.      At this point, the decision was made to turn the procedure to a vaginal approach.  All instruments were removed from the abdomen. Attention was turned to the vagina. The acorn uterine manipulator was removed. The cervix was grasped with a single tooth tenaculum and injected circumferentially with dilute pitressin. Bovie cautery was used to circumferentially incise the cervicovaginal junction. The posterior cul-de-sac was entered into with Mayo scissors. The the uterosacrals were grasped with a pair heney clamps on either side and secured with a Heaney stitch of 0 Vicryl.  The long billed duckbill retractor was then placed into the peritoneal cavity. The bladder was carefully dissected off of the cervix withsharp dissection with the Metzenbaums.   Curved Heaneys were obtained to sequentially clamp, cut, and suture ligate working up the cardinal ligaments until entry could be gained anteriorly into the peritoneum. The foley catheter was placed in the bladder.  The right side of the uterus was noted to be free.  At the level of the cornua on the left, a Heaney clamp was placed around the entire pedicle and the uterus was able to be freed. The pedicle was secured with a free tie of 0 vicryl. As the uterus was removed via the vagina, multiple small fibroids were pulled free before the entire uterus was able to be delivered through the colpotomy.   At this time, the vaginal cuff was examined.  The posterior aspect of the cuff had moderate bleeding, so the posterior peritoneum and cuff were run with 2-0 vicryl.  The was a small bleeder at the 10 o'clock portion of the cuff that was controlled with a figure of eight suture.  Care was taken to stay medial and superficial. The anterior peritoneum was unable  to be grasped to close fully prior to the closure of the vaginal cuff, so the vaginal cuff was closed with #0 Vicryl in a running, locked fashion, incorporating the bilateral uterosacrals in the midline. Following closure, the cuff was in tact and hemostatic.   Attention was then turned back to the abdomen. The camera was reinserted. The abdomen was surveyed. It was copiously irrigated.  The pedicles on the left and right side were examined and noted to be hemostatic. The vaginal cuff was re-examined and hemostatic.  The ureters were identified with bilateral peristalsis.  The upper abdomen was again evaluated.  The small defect in the omentum was identified and still hemostatic.  There were not other defects noted, either in the omentum or bowel.  At this time, all instruments were removed from the abdomen.  The fascia at the umbilicus was closed with 0-vicryl in a figure of eight stitch.  The skin incision at the umbilicus was closed with 4-0 Monocryl in a subcuticular fashion.  The lower quadrant sites were closed with a single interrupted stitch  due to bleeding followed by Dermabond. The patient tolerated all portions of procedure well. Sponge, lap, and needle count were correct x2.

## 2019-04-26 NOTE — H&P (Addendum)
58 y.o. G1P1 presents for LAVH / USO.  She has a known fibroid uterus and a slowly enlarging right sided simple ovarian cyst.  The cyst has grown from 6 to 8.9 cm over the last 3 years.  It is simple in appearance.   She has multiple uterine fibroids, largest is posterior and currently 7 cm, down from 10 cm in 2017.   The cyst has been monitored yearly as the patient was asymptomatic until this year.  More recently, she ha been experiencing intermittent pelvic pressure and urinary frequency over the last few months.   Past Medical History:  Diagnosis Date  . Palpitations    infrequent , occurs with stress situation   . PONV (postoperative nausea and vomiting)   . PVC (premature ventricular contraction)    onset 2011      Past Surgical History:  Procedure Laterality Date  . CHOLECYSTECTOMY  2012   [place] [facility] , done laparoscopically  . LEEP  1990    OB History  Gravida Para Term Preterm AB Living  1 1       1   SAB TAB Ectopic Multiple Live Births               # Outcome Date GA Lbr Len/2nd Weight Sex Delivery Anes PTL Lv  1 Para             Social History   Socioeconomic History  . Marital status: Married    Spouse name: Not on file  . Number of children: Not on file  . Years of education: Not on file  . Highest education level: Not on file  Occupational History  . Not on file  Tobacco Use  . Smoking status: Never Smoker  . Smokeless tobacco: Never Used  Substance and Sexual Activity  . Alcohol use: Yes    Comment: seldom   . Drug use: Not on file  . Sexual activity: Not on file  Other Topics Concern  . Not on file     Patient has no known allergies.    Vitals:   04/26/19 1056  BP: 137/80  Pulse: 67  Resp: 14  Temp: 97.7 F (36.5 C)  SpO2: 98%     General:  NAD Lungs: CTAB Cardiac: RRR Abdomen:  Soft Ex:  no edema    A/P   58 y.o. presents for LAVH / USO for ovarian cyst and uterine fibroids.  The cyst has been slowly enlarging over 3 years and  is simple in appearance.  There are no features on ultrasound that raise the concern for malignancy. The fibroids have been stable in size.  However, as both could be contributing to the pelvic pressure and urinary frequency, we have elected to proceed with hysterectomy along with USO.  She desires conservation of contralateral ovary if normal in appearance intraoperatively ([place] has been normal to date).   We discussed plan for minimally invasive approach, but potential need for conversion to laparotomy or possible myomectomy to complete without laparotomy.  We discussed the procedure in detail, including the risks and benefits of surgical intervention. Specifically, the patient was apprised of risks of pain, bleeding requiring blood transfusion, infection requiring antibiotics, injury to nearby organs (bowel, bladder, nerves, blood vessels, ureter), need for laparotomy to complete the operation, or failure to achieve desired results. She was informed of the low but real risk of these complications, and understands that the alternative is no surgery. An opportunity to ask questions was provided, and all questions  were answered to the patient's satisfaction. Patient expresses understanding of these issues, and agrees to proceed with the plan outlined above. The expected post-operative recovery course was discussed with the patient, and post-operative instructions were reviewed.   Consent signed.     [place]

## 2019-04-26 NOTE — Transfer of Care (Signed)
  Last Vitals:  Vitals Value Taken Time  BP 121/63 04/26/19 1527  Temp    Pulse 66 04/26/19 1530  Resp 8 04/26/19 1530  SpO2 98 % 04/26/19 1530  Vitals shown include unvalidated device data.  Last Pain:  Vitals:   04/26/19 1056  TempSrc: Oral  PainSc: 1       Patients Stated Pain Goal: 5 (04/26/19 1056)  Immediate Anesthesia Transfer of Care Note  Patient: Lori Allen  Procedure(s) Performed: Procedure(s) (LRB): LAPAROSCOPIC ASSISTED VAGINAL HYSTERECTOMY WITH SALPINGECTOMY RIGHT OOPHORECTOMY RIGHT OVARIAN CYSTECTOMY (N/A)  Patient Location: PACU  Anesthesia Type: General  Level of Consciousness: awake, alert  and oriented  Airway & Oxygen Therapy: Patient Spontanous Breathing and Patient connected to nasal cannula oxygen  Post-op Assessment: Report given to PACU RN and Post -op Vital signs reviewed and stable  Post vital signs: Reviewed and stable  Complications: No apparent anesthesia complications

## 2019-04-26 NOTE — Anesthesia Procedure Notes (Addendum)
Procedure Name: Intubation Date/Time: 04/26/2019 12:14 PM Performed by: Mechele Claude, CRNA Pre-anesthesia Checklist: Patient identified, Emergency Drugs available, Suction available and Patient being monitored Patient Re-evaluated:Patient Re-evaluated prior to induction Oxygen Delivery Method: Circle system utilized Preoxygenation: Pre-oxygenation with 100% oxygen Induction Type: IV induction Ventilation: Mask ventilation without difficulty Laryngoscope Size: Mac and 3 Grade View: Grade I Tube type: Oral Tube size: 7.0 mm Number of attempts: 1 Airway Equipment and Method: Stylet and Oral airway Placement Confirmation: ETT inserted through vocal cords under direct vision,  positive ETCO2 and breath sounds checked- equal and bilateral Secured at: 20 (at teeth) cm Tube secured with: Tape Dental Injury: Teeth and Oropharynx as per pre-operative assessment

## 2019-04-26 NOTE — Anesthesia Postprocedure Evaluation (Signed)
Anesthesia Post Note  Patient: Lori Allen  Procedure(s) Performed: LAPAROSCOPIC ASSISTED VAGINAL HYSTERECTOMY WITH SALPINGECTOMY RIGHT OOPHORECTOMY RIGHT OVARIAN CYSTECTOMY (N/A Abdomen)     Patient location during evaluation: PACU Anesthesia Type: General Level of consciousness: sedated Pain management: pain level controlled Vital Signs Assessment: post-procedure vital signs reviewed and stable Respiratory status: spontaneous breathing and respiratory function stable Cardiovascular status: stable Postop Assessment: no apparent nausea or vomiting Anesthetic complications: no    Last Vitals:  Vitals:   04/26/19 1545 04/26/19 1600  BP: 116/88 115/69  Pulse: 63 60  Resp: 15 12  Temp:  (!) 36.3 C  SpO2: 95% 96%    Last Pain:  Vitals:   04/26/19 1545  TempSrc:   PainSc: [place]

## 2019-04-27 DIAGNOSIS — D270 Benign neoplasm of right ovary: Secondary | ICD-10-CM | POA: Diagnosis not present

## 2019-04-27 LAB — CBC
HCT: 37.3 % (ref 36.0–46.0)
Hemoglobin: 12 g/dL (ref 12.0–15.0)
MCH: 31.2 pg (ref 26.0–34.0)
MCHC: 32.2 g/dL (ref 30.0–36.0)
MCV: 96.9 fL (ref 80.0–100.0)
Platelets: 196 10*3/uL (ref 150–400)
RBC: 3.85 MIL/uL — ABNORMAL LOW (ref 3.87–5.11)
RDW: 11.8 % (ref 11.5–15.5)
WBC: 11.4 10*3/uL — ABNORMAL HIGH (ref 4.0–10.5)
nRBC: 0 % (ref 0.0–0.2)

## 2019-04-27 MED ORDER — ACETAMINOPHEN 500 MG PO TABS
ORAL_TABLET | ORAL | Status: AC
  Filled 2019-04-27: qty 2

## 2019-04-27 MED ORDER — OXYCODONE HCL 5 MG PO TABS: 2.5000 mg | ORAL_TABLET | ORAL | 0 refills | Status: DC | PRN

## 2019-04-27 MED ORDER — IBUPROFEN 800 MG PO TABS: 800.0000 mg | ORAL_TABLET | Freq: Three times a day (TID) | ORAL | 0 refills | Status: DC

## 2019-04-27 MED ORDER — IBUPROFEN 800 MG PO TABS
ORAL_TABLET | ORAL | Status: AC
  Filled 2019-04-27: qty 1

## 2019-04-27 MED ORDER — OXYCODONE HCL 5 MG PO TABS
ORAL_TABLET | ORAL | Status: AC
  Filled 2019-04-27: qty 1

## 2019-04-27 MED ORDER — OXYCODONE HCL 5 MG PO TABS
2.5000 mg | ORAL_TABLET | ORAL | Status: DC | PRN
  Administered 2019-04-27: 2.5 mg via ORAL
  Filled 2019-04-27: qty 2

## 2019-04-27 NOTE — Discharge Instructions (Signed)
Laparoscopically Assisted Vaginal Hysterectomy, Care After This sheet gives you information about how to care for yourself after your procedure. Your health care provider may also give you more specific instructions. If you have problems or questions, contact your health care provider. What can I expect after the procedure? After the procedure, it is common to have:  Soreness and numbness in your incision areas.  Abdominal pain. You will be given pain medicine to control it.  Vaginal bleeding and discharge. You will need to use a sanitary napkin after this procedure.  Sore throat from the breathing tube that was inserted during surgery. Follow these instructions at home: Medicines  Take over-the-counter and prescription medicines only as told by your health care provider.  Do not take aspirin or ibuprofen. These medicines can cause bleeding.  Do not drive or use heavy machinery while taking prescription pain medicine.  Do not drive for 24 hours if you were given a medicine to help you relax (sedative) during the procedure. Incision care   Follow instructions from your health care provider about how to take care of your incisions. Make sure you: ? Wash your hands with soap and water before you change your bandage (dressing). If soap and water are not available, use hand sanitizer. ? Change your dressing as told by your health care provider. ? Leave stitches (sutures), skin glue, or adhesive strips in place. These skin closures may need to stay in place for 2 weeks or longer. If adhesive strip edges start to loosen and curl up, you may trim the loose edges. Do not remove adhesive strips completely unless your health care provider tells you to do that.  Check your incision area every day for signs of infection. Check for: ? Redness, swelling, or pain. ? Fluid or blood. ? Warmth. ? Pus or a bad smell. Activity  Get regular exercise as told by your health care provider. You may be  told to take short walks every day and go farther each time.  Return to your normal activities as told by your health care provider. Ask your health care provider what activities are safe for you.  Do not douche, use tampons, or have sexual intercourse for at least 6 weeks, or until your health care provider gives you permission.  Do not lift anything that is heavier than 10 lb (4.5 kg), or the limit that your health care provider tells you, until he or she says that it is safe. General instructions  Do not take baths, swim, or use a hot tub until your health care provider approves. Take showers instead of baths.  Do not drive for 24 hours if you received a sedative.  Do not drive or operate heavy machinery while taking prescription pain medicine.  To prevent or treat constipation while you are taking prescription pain medicine, your health care provider may recommend that you: ? Drink enough fluid to keep your urine clear or pale yellow. ? Take over-the-counter or prescription medicines. ? Eat foods that are high in fiber, such as fresh fruits and vegetables, whole grains, and beans. ? Limit foods that are high in fat and processed sugars, such as fried and sweet foods.  Keep all follow-up visits as told by your health care provider. This is important. Contact a health care provider if:  You have signs of infection, such as: ? Redness, swelling, or pain around your incision sites. ? Fluid or blood coming from an incision. ? An incision that feels warm to the   touch. ? Pus or a bad smell coming from an incision.  Your incision breaks open.  Your pain medicine is not helping.  You feel dizzy or light-headed.  You have pain or bleeding when you urinate.  You have persistent nausea and vomiting.  You have blood, pus, or a bad-smelling discharge from your vagina. Get help right away if:  You have a fever.  You have severe abdominal pain.  You have chest pain.  You have  shortness of breath.  You faint.  You have pain, swelling, or redness in your leg.  You have heavy bleeding from your vagina. Summary  After the procedure, it is common to have abdominal pain and vaginal bleeding.  You should not drive or lift heavy objects until your health care provider says that it is safe.  Contact your health care provider if you have any symptoms of infection, excessive vaginal bleeding, nausea, vomiting, or shortness of breath. This information is not intended to replace advice given to you by your health care provider. Make sure you discuss any questions you have with your health care provider. Document Released: 04/17/2011 Document Revised: 04/10/2017 Document Reviewed: 06/24/2016 Elsevier Patient Education  2020 [employer] may wash incision with soap and water.  Do not soak the incisions for 2 weeks (no tub baths or swimming).  If you note drainage, increased pain, or increased redness of the incision, then please notify your physician.  Pelvic rest x 6 weeks (no intercourse or tampons)   No lifting over 10-15  lbs for 6 weeks.   Do not drive until you are not taking narcotic pain medication AND you can comfortably slam on the brakes.

## 2019-04-27 NOTE — Discharge Summary (Signed)
Physician Discharge Summary  Patient ID: Lori Allen MRN: [id number] DOB/AGE: Sep 27, 1960 58 y.o.  Admit date: 04/26/2019 Discharge date: 04/27/2019  Admission Diagnoses:uterine fibroids and ovarian cyst Discharge Diagnoses:  Active Problems:   Ovarian cyst   Discharged Condition: good  Hospital Course: 58 yo female presented for LAVH /BS / RO for suspected right adnexal cyst and uterine fibroids.  She had been followed with serial ultrasounds for gradually enlarging right adnexal simple cyst.  She began to develop symptoms, so decided to undergo surgical management.  She was taken to the OR for a LAVH / BS / RO.  EBL was 219mL.  She was transferred to recovery for overnight observation.  She did well post op.  By POD#1, she was ambulating, voiding spontaneously after removal of foley catheter and tolerating a regular diet without nausea or vomiting.  Her pain was well controlled.  Her vital signs were stable.  Her abdominal exam was benign. She was deemed appropriate for discharge to home   Discharge Exam: Blood pressure (!) 107/56, pulse 63, temperature 98.3 F (36.8 C), resp. rate 16, height 5\' 2"  (1.575 m), weight 69.7 kg, SpO2 98 %. General appearance: alert, cooperative and appears stated age GI: soft, mild appropriate tenderness Incision/Wound: clean / dry / in tact.  Mild bruising RLQ incision  Disposition: Discharge disposition: 01-Home or Self Care       Discharge Instructions    Call MD for:  difficulty breathing, headache or visual disturbances   Complete by: As directed    Call MD for:  extreme fatigue   Complete by: As directed    Call MD for:  hives   Complete by: As directed    Call MD for:  persistant dizziness or light-headedness   Complete by: As directed    Call MD for:  persistant nausea and vomiting   Complete by: As directed    Call MD for:  redness, tenderness, or signs of infection (pain, swelling, redness, odor or green/yellow discharge around  incision site)   Complete by: As directed    Call MD for:  severe uncontrolled pain   Complete by: As directed    Call MD for:  temperature >100.4   Complete by: As directed    Diet general   Complete by: As directed    Driving restriction   Complete by: As directed    Avoid driving for [id number] days.  Do not drive while taking narcotics   Lifting restrictions   Complete by: As directed    Weight restriction of 10 lbs.   No dressing needed   Complete by: As directed    Sexual activity   Complete by: As directed    Pelvic rest x 6 weeks--nothing in the vagina--no sex or tampons     Allergies as of 04/27/2019   No Known Allergies     Medication List    STOP taking these medications   ALPRAZolam 0.25 MG tablet Commonly known as: XANAX     TAKE these medications   atorvastatin 10 MG tablet Commonly known as: LIPITOR Take 10 mg by mouth daily.   carvedilol 3.125 MG tablet Commonly known as: COREG Take 3.125 mg by mouth 2 (two) times daily with a meal.   fluticasone 50 MCG/ACT nasal spray Commonly known as: FLONASE Place 1 spray into both nostrils daily.   ibuprofen 800 MG tablet Commonly known as: ADVIL Take 1 tablet (800 mg total) by mouth every 8 (eight) hours. What changed:  medication strength  how much to take  when to take this  reasons to take this Notes to patient: May take next dose at 4:00pm   levocetirizine 5 MG tablet Commonly known as: XYZAL Take 5 mg by mouth daily as needed for allergies.   multivitamin with minerals tablet Take 1 tablet by mouth daily.   oxyCODONE 5 MG immediate release tablet Commonly known as: Oxy IR/ROXICODONE Take 0.5-1 tablets (2.5-5 mg total) by mouth every 4 (four) hours as needed for severe pain. Notes to patient: May take a dose at any time      Follow-up Information    Jerelyn Charles, MD Follow up on 05/16/2019.   Specialty: Obstetrics Why: post op visit Contact information: [address]  [place] [address] [phone]           Signed: [place] 04/27/2019, 7:58 AM

## 2019-04-27 NOTE — Progress Notes (Signed)
Patient is doing well.  She is ambulating, voiding, tolerating PO.  Pain control is good.  Vaginal bleeding is minimal.  She notes a bit of swelling in the right corner of her inner lip that is only mildly tender  Vitals:   04/26/19 1745 04/26/19 2155 04/27/19 0207 04/27/19 0605  BP: 137/71 124/63 (!) 109/57 (!) 107/56  Pulse: (!) 56 73 65 63  Resp:  20 20 16   Temp: 97.8 F (36.6 C) 98.3 F (36.8 C) 98.6 F (37 C) 98.3 F (36.8 C)  TempSrc:      SpO2: 95% 93% 97% 98%  Weight:      Height:        NAD Abdomen soft, mildly distended, appropriately tender Incisions c/d/i Ext no edema Pelvic: scant spotting on pad  Lab Results  Component Value Date   WBC 11.4 (H) 04/27/2019   HGB 12.0 04/27/2019   HCT 37.3 04/27/2019   MCV 96.9 04/27/2019   PLT 196 04/27/2019      A/P 58 y.o. POD#1 s/p LAVH / BS / RO for uterine fibroids and right ovarian cyst.  Meeting all goals, d/c to home today  [place]

## 2019-04-28 LAB — CYTOLOGY - NON PAP

## 2019-04-28 LAB — SURGICAL PATHOLOGY

## 2019-04-28 NOTE — Addendum Note (Signed)
Addendum  created 04/28/19 0701 by Justice Rocher, CRNA   Charge Capture section accepted

## 2019-04-28 NOTE — Addendum Note (Signed)
Addendum  created 04/28/19 0704 by Justice Rocher, CRNA   Charge Capture section accepted

## 2019-05-13 HISTORY — PX: BREAST LUMPECTOMY: SHX2

## 2019-05-13 HISTORY — PX: BREAST BIOPSY: SHX20

## 2019-05-25 ENCOUNTER — Other Ambulatory Visit: Payer: Self-pay | Admitting: Obstetrics

## 2019-05-25 DIAGNOSIS — N6459 Other signs and symptoms in breast: Secondary | ICD-10-CM

## 2019-06-08 ENCOUNTER — Ambulatory Visit
Admission: RE | Admit: 2019-06-08 | Discharge: 2019-06-08 | Disposition: A | Discharge status: Home / Self Care | Payer: 59 | Source: Ambulatory Visit | Attending: Obstetrics | Admitting: Obstetrics

## 2019-06-08 ENCOUNTER — Ambulatory Visit
Admission: RE | Admit: 2019-06-08 | Discharge: 2019-06-08 | Disposition: A | Discharge status: Home / Self Care | Payer: Managed Care, Other (non HMO) | Source: Ambulatory Visit | Attending: Obstetrics | Admitting: Obstetrics

## 2019-06-08 ENCOUNTER — Other Ambulatory Visit: Payer: Self-pay

## 2019-06-08 ENCOUNTER — Other Ambulatory Visit: Payer: Self-pay | Admitting: Obstetrics

## 2019-06-08 DIAGNOSIS — N6459 Other signs and symptoms in breast: Secondary | ICD-10-CM

## 2019-06-10 ENCOUNTER — Ambulatory Visit
Admission: RE | Admit: 2019-06-10 | Discharge: 2019-06-10 | Disposition: A | Discharge status: Home / Self Care | Payer: 59 | Source: Ambulatory Visit | Attending: Obstetrics | Admitting: Obstetrics

## 2019-06-10 ENCOUNTER — Other Ambulatory Visit: Payer: Self-pay

## 2019-06-10 DIAGNOSIS — N6459 Other signs and symptoms in breast: Secondary | ICD-10-CM

## 2019-06-28 ENCOUNTER — Ambulatory Visit: Payer: Self-pay | Admitting: General Surgery

## 2019-06-28 DIAGNOSIS — N6310 Unspecified lump in the right breast, unspecified quadrant: Secondary | ICD-10-CM

## 2019-07-15 ENCOUNTER — Other Ambulatory Visit: Payer: Self-pay | Admitting: General Surgery

## 2019-07-15 DIAGNOSIS — N6310 Unspecified lump in the right breast, unspecified quadrant: Secondary | ICD-10-CM

## 2019-08-19 ENCOUNTER — Encounter (HOSPITAL_BASED_OUTPATIENT_CLINIC_OR_DEPARTMENT_OTHER): Payer: Self-pay | Admitting: General Surgery

## 2019-08-19 ENCOUNTER — Other Ambulatory Visit: Payer: Self-pay

## 2019-08-23 ENCOUNTER — Other Ambulatory Visit (HOSPITAL_COMMUNITY)
Admission: RE | Admit: 2019-08-23 | Discharge: 2019-08-23 | Disposition: A | Discharge status: Home / Self Care | Payer: 59 | Source: Ambulatory Visit | Attending: General Surgery | Admitting: General Surgery

## 2019-08-23 DIAGNOSIS — Z20822 Contact with and (suspected) exposure to covid-19: Secondary | ICD-10-CM | POA: Diagnosis not present

## 2019-08-23 DIAGNOSIS — Z01812 Encounter for preprocedural laboratory examination: Secondary | ICD-10-CM | POA: Diagnosis present

## 2019-08-23 LAB — SARS CORONAVIRUS 2 (TAT 6-24 HRS): SARS Coronavirus 2: NEGATIVE

## 2019-08-23 NOTE — Progress Notes (Signed)
      Enhanced Recovery after Surgery for Orthopedics Enhanced Recovery after Surgery is a protocol used to improve the stress on your body and your recovery after surgery.  Patient Instructions  . The night before surgery:  o No food after midnight. ONLY clear liquids after midnight  . The day of surgery (if you do NOT have diabetes):  o Drink ONE (1) Pre-Surgery Clear Ensure as directed.   o This drink was given to you during your hospital  pre-op appointment visit. o The pre-op nurse will instruct you on the time to drink the  Pre-Surgery Ensure depending on your surgery time. o Finish the drink at the designated time by the pre-op nurse.  o Nothing else to drink after completing the  Pre-Surgery Clear Ensure.  . The day of surgery (if you have diabetes): o Drink ONE (1) Gatorade 2 (G2) as directed. o This drink was given to you during your hospital  pre-op appointment visit.  o The pre-op nurse will instruct you on the time to drink the   Gatorade 2 (G2) depending on your surgery time. o Color of the Gatorade may vary. Red is not allowed. o Nothing else to drink after completing the  Gatorade 2 (G2).         If you have questions, please contact your surgeon's office.  Surgical soap also given to pt with instructions for use.  Pt verbalized understanding of instructions.

## 2019-08-25 ENCOUNTER — Ambulatory Visit
Admission: RE | Admit: 2019-08-25 | Discharge: 2019-08-25 | Disposition: A | Discharge status: Home / Self Care | Payer: 59 | Source: Ambulatory Visit | Attending: General Surgery | Admitting: General Surgery

## 2019-08-25 ENCOUNTER — Other Ambulatory Visit: Payer: Self-pay

## 2019-08-25 DIAGNOSIS — N6310 Unspecified lump in the right breast, unspecified quadrant: Secondary | ICD-10-CM

## 2019-08-25 HISTORY — PX: BREAST EXCISIONAL BIOPSY: SUR124

## 2019-08-26 ENCOUNTER — Ambulatory Visit (HOSPITAL_BASED_OUTPATIENT_CLINIC_OR_DEPARTMENT_OTHER): Payer: 59 | Admitting: Certified Registered"

## 2019-08-26 ENCOUNTER — Encounter (HOSPITAL_BASED_OUTPATIENT_CLINIC_OR_DEPARTMENT_OTHER): Payer: Self-pay | Admitting: General Surgery

## 2019-08-26 ENCOUNTER — Encounter (HOSPITAL_BASED_OUTPATIENT_CLINIC_OR_DEPARTMENT_OTHER)
Admission: RE | Disposition: A | Discharge status: Home / Self Care | Payer: Self-pay | Source: Home / Self Care | Attending: General Surgery

## 2019-08-26 ENCOUNTER — Ambulatory Visit (HOSPITAL_BASED_OUTPATIENT_CLINIC_OR_DEPARTMENT_OTHER)
Admission: RE | Admit: 2019-08-26 | Discharge: 2019-08-26 | Disposition: A | Discharge status: Home / Self Care | Payer: 59 | Attending: General Surgery | Admitting: General Surgery

## 2019-08-26 ENCOUNTER — Other Ambulatory Visit: Payer: Self-pay

## 2019-08-26 ENCOUNTER — Ambulatory Visit
Admission: RE | Admit: 2019-08-26 | Discharge: 2019-08-26 | Disposition: A | Discharge status: Home / Self Care | Payer: 59 | Source: Ambulatory Visit | Attending: General Surgery | Admitting: General Surgery

## 2019-08-26 DIAGNOSIS — Z79899 Other long term (current) drug therapy: Secondary | ICD-10-CM | POA: Diagnosis not present

## 2019-08-26 DIAGNOSIS — Z87891 Personal history of nicotine dependence: Secondary | ICD-10-CM | POA: Insufficient documentation

## 2019-08-26 DIAGNOSIS — N61 Mastitis without abscess: Secondary | ICD-10-CM | POA: Diagnosis not present

## 2019-08-26 DIAGNOSIS — N6315 Unspecified lump in the right breast, overlapping quadrants: Secondary | ICD-10-CM | POA: Diagnosis present

## 2019-08-26 DIAGNOSIS — N6489 Other specified disorders of breast: Secondary | ICD-10-CM | POA: Insufficient documentation

## 2019-08-26 DIAGNOSIS — N6310 Unspecified lump in the right breast, unspecified quadrant: Secondary | ICD-10-CM

## 2019-08-26 HISTORY — PX: BREAST LUMPECTOMY WITH RADIOACTIVE SEED LOCALIZATION: SHX6424

## 2019-08-26 SURGERY — BREAST LUMPECTOMY WITH RADIOACTIVE SEED LOCALIZATION
Anesthesia: General | Site: Breast | Laterality: Right

## 2019-08-26 MED ORDER — LACTATED RINGERS IV SOLN: INTRAVENOUS | Status: DC

## 2019-08-26 MED ORDER — LIDOCAINE HCL (CARDIAC) PF 100 MG/5ML IV SOSY
PREFILLED_SYRINGE | INTRAVENOUS | Status: DC | PRN
  Administered 2019-08-26: 100 mg via INTRAVENOUS

## 2019-08-26 MED ORDER — CEFAZOLIN SODIUM-DEXTROSE 2-4 GM/100ML-% IV SOLN
2.0000 g | INTRAVENOUS | Status: AC
  Administered 2019-08-26: 2 g via INTRAVENOUS

## 2019-08-26 MED ORDER — MIDAZOLAM HCL 5 MG/5ML IJ SOLN
INTRAMUSCULAR | Status: DC | PRN
  Administered 2019-08-26: 2 mg via INTRAVENOUS

## 2019-08-26 MED ORDER — EPHEDRINE 5 MG/ML INJ
INTRAVENOUS | Status: AC
  Filled 2019-08-26: qty 10

## 2019-08-26 MED ORDER — FENTANYL CITRATE (PF) 100 MCG/2ML IJ SOLN
INTRAMUSCULAR | Status: AC
  Filled 2019-08-26: qty 2

## 2019-08-26 MED ORDER — METHYLENE BLUE 0.5 % INJ SOLN
INTRAVENOUS | Status: AC
  Filled 2019-08-26: qty 10

## 2019-08-26 MED ORDER — CELECOXIB 200 MG PO CAPS
200.0000 mg | ORAL_CAPSULE | ORAL | Status: AC
  Administered 2019-08-26: 200 mg via ORAL

## 2019-08-26 MED ORDER — MIDAZOLAM HCL 2 MG/2ML IJ SOLN: 1.0000 mg | INTRAMUSCULAR | Status: DC | PRN

## 2019-08-26 MED ORDER — GABAPENTIN 300 MG PO CAPS
ORAL_CAPSULE | ORAL | Status: AC
  Filled 2019-08-26: qty 1

## 2019-08-26 MED ORDER — LIDOCAINE-EPINEPHRINE 1 %-1:100000 IJ SOLN
INTRAMUSCULAR | Status: AC
  Filled 2019-08-26: qty 1

## 2019-08-26 MED ORDER — FENTANYL CITRATE (PF) 100 MCG/2ML IJ SOLN: 50.0000 ug | INTRAMUSCULAR | Status: DC | PRN

## 2019-08-26 MED ORDER — BUPIVACAINE HCL (PF) 0.25 % IJ SOLN
INTRAMUSCULAR | Status: AC
  Filled 2019-08-26: qty 30

## 2019-08-26 MED ORDER — EPHEDRINE SULFATE 50 MG/ML IJ SOLN
INTRAMUSCULAR | Status: DC | PRN
  Administered 2019-08-26 (×4): 10 mg via INTRAVENOUS

## 2019-08-26 MED ORDER — ONDANSETRON HCL 4 MG/2ML IJ SOLN: 4.0000 mg | Freq: Once | INTRAMUSCULAR | Status: DC | PRN

## 2019-08-26 MED ORDER — CEFAZOLIN SODIUM-DEXTROSE 2-4 GM/100ML-% IV SOLN
INTRAVENOUS | Status: AC
  Filled 2019-08-26: qty 100

## 2019-08-26 MED ORDER — KETOROLAC TROMETHAMINE 30 MG/ML IJ SOLN: 30.0000 mg | Freq: Once | INTRAMUSCULAR | Status: DC | PRN

## 2019-08-26 MED ORDER — OXYCODONE HCL 5 MG/5ML PO SOLN: 5.0000 mg | Freq: Once | ORAL | Status: DC | PRN

## 2019-08-26 MED ORDER — ONDANSETRON HCL 4 MG/2ML IJ SOLN
INTRAMUSCULAR | Status: DC | PRN
  Administered 2019-08-26: 4 mg via INTRAVENOUS

## 2019-08-26 MED ORDER — PROPOFOL 10 MG/ML IV BOLUS
INTRAVENOUS | Status: DC | PRN
  Administered 2019-08-26: 150 mg via INTRAVENOUS

## 2019-08-26 MED ORDER — ACETAMINOPHEN 500 MG PO TABS
1000.0000 mg | ORAL_TABLET | ORAL | Status: AC
  Administered 2019-08-26: 1000 mg via ORAL

## 2019-08-26 MED ORDER — DEXAMETHASONE SODIUM PHOSPHATE 10 MG/ML IJ SOLN
INTRAMUSCULAR | Status: DC | PRN
  Administered 2019-08-26: 5 mg via INTRAVENOUS

## 2019-08-26 MED ORDER — ACETAMINOPHEN 500 MG PO TABS
ORAL_TABLET | ORAL | Status: AC
  Filled 2019-08-26: qty 2

## 2019-08-26 MED ORDER — FENTANYL CITRATE (PF) 100 MCG/2ML IJ SOLN: 25.0000 ug | INTRAMUSCULAR | Status: DC | PRN

## 2019-08-26 MED ORDER — SCOPOLAMINE 1 MG/3DAYS TD PT72
MEDICATED_PATCH | TRANSDERMAL | Status: AC
  Filled 2019-08-26: qty 1

## 2019-08-26 MED ORDER — GABAPENTIN 300 MG PO CAPS
300.0000 mg | ORAL_CAPSULE | ORAL | Status: AC
  Administered 2019-08-26: 300 mg via ORAL

## 2019-08-26 MED ORDER — FENTANYL CITRATE (PF) 100 MCG/2ML IJ SOLN
INTRAMUSCULAR | Status: DC | PRN
  Administered 2019-08-26: 25 ug via INTRAVENOUS

## 2019-08-26 MED ORDER — HYDROCODONE-ACETAMINOPHEN 5-325 MG PO TABS: 1.0000 | ORAL_TABLET | Freq: Four times a day (QID) | ORAL | 0 refills | Status: DC | PRN

## 2019-08-26 MED ORDER — BUPIVACAINE HCL (PF) 0.5 % IJ SOLN
INTRAMUSCULAR | Status: AC
  Filled 2019-08-26: qty 30

## 2019-08-26 MED ORDER — CHLORHEXIDINE GLUCONATE CLOTH 2 % EX PADS: 6.0000 | MEDICATED_PAD | Freq: Once | CUTANEOUS | Status: DC

## 2019-08-26 MED ORDER — OXYCODONE HCL 5 MG PO TABS: 5.0000 mg | ORAL_TABLET | Freq: Once | ORAL | Status: DC | PRN

## 2019-08-26 MED ORDER — BUPIVACAINE HCL (PF) 0.25 % IJ SOLN
INTRAMUSCULAR | Status: DC | PRN
  Administered 2019-08-26: 20 mL

## 2019-08-26 MED ORDER — MIDAZOLAM HCL 2 MG/2ML IJ SOLN
INTRAMUSCULAR | Status: AC
  Filled 2019-08-26: qty 2

## 2019-08-26 MED ORDER — CELECOXIB 200 MG PO CAPS
ORAL_CAPSULE | ORAL | Status: AC
  Filled 2019-08-26: qty 1

## 2019-08-26 SURGICAL SUPPLY — 49 items
ADH SKN CLS APL DERMABOND .7 (GAUZE/BANDAGES/DRESSINGS) ×1
APL PRP STRL LF DISP 70% ISPRP (MISCELLANEOUS) ×1
APPLIER CLIP 9.375 MED OPEN (MISCELLANEOUS)
APR CLP MED 9.3 20 MLT OPN (MISCELLANEOUS)
BLADE SURG 15 STRL LF DISP TIS (BLADE) ×1 IMPLANT
BLADE SURG 15 STRL SS (BLADE) ×2
CANISTER SUC SOCK COL 7IN (MISCELLANEOUS) ×1 IMPLANT
CANISTER SUCT 1200ML W/VALVE (MISCELLANEOUS) ×2 IMPLANT
CHLORAPREP W/TINT 26 (MISCELLANEOUS) ×2 IMPLANT
CLIP APPLIE 9.375 MED OPEN (MISCELLANEOUS) IMPLANT
COVER BACK TABLE 60X90IN (DRAPES) ×2 IMPLANT
COVER MAYO STAND STRL (DRAPES) ×2 IMPLANT
COVER PROBE W GEL 5X96 (DRAPES) ×2 IMPLANT
COVER WAND RF STERILE (DRAPES) IMPLANT
DECANTER SPIKE VIAL GLASS SM (MISCELLANEOUS) ×1 IMPLANT
DERMABOND ADVANCED (GAUZE/BANDAGES/DRESSINGS) ×1
DERMABOND ADVANCED .7 DNX12 (GAUZE/BANDAGES/DRESSINGS) ×1 IMPLANT
DRAPE LAPAROSCOPIC ABDOMINAL (DRAPES) ×2 IMPLANT
DRAPE UTILITY XL STRL (DRAPES) ×2 IMPLANT
ELECT COATED BLADE 2.86 ST (ELECTRODE) ×2 IMPLANT
ELECT REM PT RETURN 9FT ADLT (ELECTROSURGICAL) ×2
ELECTRODE REM PT RTRN 9FT ADLT (ELECTROSURGICAL) ×1 IMPLANT
GLOVE BIO SURGEON STRL SZ 6.5 (GLOVE) ×1 IMPLANT
GLOVE BIO SURGEON STRL SZ7 (GLOVE) ×1 IMPLANT
GLOVE BIO SURGEON STRL SZ7.5 (GLOVE) ×4 IMPLANT
GLOVE BIOGEL PI IND STRL 6.5 (GLOVE) IMPLANT
GLOVE BIOGEL PI IND STRL 7.0 (GLOVE) IMPLANT
GLOVE BIOGEL PI INDICATOR 6.5 (GLOVE) ×1
GLOVE BIOGEL PI INDICATOR 7.0 (GLOVE) ×1
GOWN STRL REUS W/ TWL LRG LVL3 (GOWN DISPOSABLE) ×2 IMPLANT
GOWN STRL REUS W/TWL LRG LVL3 (GOWN DISPOSABLE) ×4
ILLUMINATOR WAVEGUIDE N/F (MISCELLANEOUS) IMPLANT
KIT MARKER MARGIN INK (KITS) ×2 IMPLANT
LIGHT WAVEGUIDE WIDE FLAT (MISCELLANEOUS) IMPLANT
NDL HYPO 25X1 1.5 SAFETY (NEEDLE) IMPLANT
NEEDLE HYPO 25X1 1.5 SAFETY (NEEDLE) ×2 IMPLANT
NS IRRIG 1000ML POUR BTL (IV SOLUTION) ×1 IMPLANT
PACK BASIN DAY SURGERY FS (CUSTOM PROCEDURE TRAY) ×2 IMPLANT
PENCIL SMOKE EVACUATOR (MISCELLANEOUS) ×2 IMPLANT
SLEEVE SCD COMPRESS KNEE MED (MISCELLANEOUS) ×2 IMPLANT
SPONGE LAP 18X18 RF (DISPOSABLE) ×2 IMPLANT
SUT MON AB 4-0 PC3 18 (SUTURE) ×2 IMPLANT
SUT SILK 2 0 SH (SUTURE) IMPLANT
SUT VICRYL 3-0 CR8 SH (SUTURE) ×2 IMPLANT
SYR CONTROL 10ML LL (SYRINGE) IMPLANT
TOWEL GREEN STERILE FF (TOWEL DISPOSABLE) ×2 IMPLANT
TRAY FAXITRON CT DISP (TRAY / TRAY PROCEDURE) ×2 IMPLANT
TUBE CONNECTING 20X1/4 (TUBING) ×1 IMPLANT
YANKAUER SUCT BULB TIP NO VENT (SUCTIONS) IMPLANT

## 2019-08-26 NOTE — H&P (Signed)
Lori Allen  Location: [facility] Surgery Patient #: [id number] DOB: 12-25-1960 Divorced / Language: Lori Allen / Race: White Female   History of Present Illness  The patient is a 59 year old female who presents with a breast mass. We are asked to see the patient in consultation by Dr. Jerelyn Charles to evaluate her for a mass in the right breast. The patient is a 59 year old female who recently developed right nipple inversion in November. This occurred about a week after her mammogram which was read as negative. She has had some occasional shooting pains in the right breast. She was evaluated with mammogram and ultrasound was evidence of an intraductal mass in the 12 o'clock position centrally in the right breast. This was biopsied and came back as fibrocystic tissue. The radiologist felt that this was discordant and have recommended removal of the area. She is otherwise in good health. She does not smoke but does live with somebody who does. She has no family history of breast cancer.   Past Surgical History  Breast Biopsy  Right. Gallbladder Surgery - Laparoscopic  Hysterectomy (not due to cancer) - Partial   Diagnostic Studies History  Colonoscopy  5-10 years ago Mammogram  within last year Pap Smear  1-5 years ago  Allergies  No Known Drug Allergies   Medication History Atorvastatin Calcium (10MG  Tablet, Oral) Active. Carvedilol (3.125MG  Tablet, Oral) Active. Flonase (50MCG/ACT Suspension, Nasal) Active. Multivitamins/Minerals (Oral) Active. Medications Reconciled  Social History  Alcohol use  Occasional alcohol use. Caffeine use  Tea. No drug use  Tobacco use  Former smoker.  Family History  Hypertension  Mother. Melanoma  Father.  Pregnancy / Birth History  Age at menarche  55 years. Age of menopause  51-55 Contraceptive History  Oral contraceptives. Gravida  1 Length (months) of breastfeeding  3-6 Maternal age  40-20 Para   1  Other Problems Cholelithiasis  Oophorectomy     Review of Systems  General Not Present- Appetite Loss, Chills, Fatigue, Fever, Night Sweats, Weight Gain and Weight Loss. Skin Not Present- Change in Wart/Mole, Dryness, Hives, Jaundice, New Lesions, Non-Healing Wounds, Rash and Ulcer. HEENT Not Present- Earache, Hearing Loss, Hoarseness, Nose Bleed, Oral Ulcers, Ringing in the Ears, Seasonal Allergies, Sinus Pain, Sore Throat, Visual Disturbances, Wears glasses/contact lenses and Yellow Eyes. Respiratory Not Present- Bloody sputum, Chronic Cough, Difficulty Breathing, Snoring and Wheezing. Breast Not Present- Breast Mass, Breast Pain, Nipple Discharge and Skin Changes. Cardiovascular Not Present- Chest Pain, Difficulty Breathing Lying Down, Leg Cramps, Palpitations, Rapid Heart Rate, Shortness of Breath and Swelling of Extremities. Gastrointestinal Not Present- Abdominal Pain, Bloating, Bloody Stool, Change in Bowel Habits, Chronic diarrhea, Constipation, Difficulty Swallowing, Excessive gas, Gets full quickly at meals, Hemorrhoids, Indigestion, Nausea, Rectal Pain and Vomiting. Female Genitourinary Not Present- Frequency, Nocturia, Painful Urination, Pelvic Pain and Urgency. Musculoskeletal Not Present- Back Pain, Joint Pain, Joint Stiffness, Muscle Pain, Muscle Weakness and Swelling of Extremities. Neurological Not Present- Decreased Memory, Fainting, Headaches, Numbness, Seizures, Tingling, Tremor, Trouble walking and Weakness. Psychiatric Not Present- Anxiety, Bipolar, Change in Sleep Pattern, Depression, Fearful and Frequent crying. Endocrine Not Present- Cold Intolerance, Excessive Hunger, Hair Changes, Heat Intolerance, Hot flashes and New Diabetes. Hematology Not Present- Blood Thinners, Easy Bruising, Excessive bleeding, Gland problems, HIV and Persistent Infections.  Vitals  Weight: 153.13 lb Height: 62in Body Surface Area: 1.71 m Body Mass Index: 28.01 kg/m  Temp.:  97.18F  Pulse: 64 (Regular)  P.OX: 97% (Room air) BP: 114/78 (Sitting, Left Arm, Standard)  Physical Exam  General Mental Status-Alert. General Appearance-Consistent with stated age. Hydration-Well hydrated. Voice-Normal.  Head and Neck Head-normocephalic, atraumatic with no lesions or palpable masses. Trachea-midline. Thyroid Gland Characteristics - normal size and consistency.  Eye Eyeball - Bilateral-Extraocular movements intact. Sclera/Conjunctiva - Bilateral-No scleral icterus.  Chest and Lung Exam Chest and lung exam reveals -quiet, even and easy respiratory effort with no use of accessory muscles and on auscultation, normal breath sounds, no adventitious sounds and normal vocal resonance. Inspection Chest Wall - Normal. Back - normal.  Breast Note: There is no palpable mass in either breast. There is no palpable axillary, supraclavicular, or cervical lymphadenopathy. She does have right nipple inversion.   Cardiovascular Cardiovascular examination reveals -normal heart sounds, regular rate and rhythm with no murmurs and normal pedal pulses bilaterally.  Abdomen Inspection Inspection of the abdomen reveals - No Hernias. Skin - Scar - no surgical scars. Palpation/Percussion Palpation and Percussion of the abdomen reveal - Soft, Non Tender, No Rebound tenderness, No Rigidity (guarding) and No hepatosplenomegaly. Auscultation Auscultation of the abdomen reveals - Bowel sounds normal.  Neurologic Neurologic evaluation reveals -alert and oriented x 3 with no impairment of recent or remote memory. Mental Status-Normal.  Musculoskeletal Normal Exam - Left-Upper Extremity Strength Normal and Lower Extremity Strength Normal. Normal Exam - Right-Upper Extremity Strength Normal and Lower Extremity Strength Normal.  Lymphatic Head & Neck  General Head & Neck Lymphatics: Bilateral - Description - Normal. Axillary  General  Axillary Region: Bilateral - Description - Normal. Tenderness - Non Tender. Femoral & Inguinal  Generalized Femoral & Inguinal Lymphatics: Bilateral - Description - Normal. Tenderness - Non Tender.    Assessment & Plan  BREAST MASS, RIGHT (N63.10) Impression: The patient appears to have an intraductal mass in an area of distortion in the 12:00 central right breast. This was biopsied and came back as fibrocystic tissue. The radiologist felt that this is discordant and have recommended that this area be removed. I think this is a very reasonable thing to do the appearance. I have discussed with her in detail the risks and benefits of the operation as well as some of the technical aspects including the use of a radioactive seed for localization and she understands and wishes to proceed. This patient encounter took 60 minutes today to perform the following: take history, perform exam, review outside records, interpret imaging, counsel the patient on their diagnosis and document encounter, findings & plan in the EHR

## 2019-08-26 NOTE — Op Note (Signed)
08/26/2019  9:14 AM  PATIENT:  Lori Allen  59 y.o. female  PRE-OPERATIVE DIAGNOSIS:  RIGHT BREAST DISCORDANCE  POST-OPERATIVE DIAGNOSIS:  RIGHT BREAST DISCORDANCE  PROCEDURE:  Procedure(s): RIGHT BREAST LUMPECTOMY WITH RADIOACTIVE SEED LOCALIZATION (Right)  SURGEON:  Surgeon(s) and Role:    * Jovita Kussmaul, MD - Primary  PHYSICIAN ASSISTANT:   ASSISTANTS: none   ANESTHESIA:   local and general  EBL:  Minimal   BLOOD ADMINISTERED:none  DRAINS: none   LOCAL MEDICATIONS USED:  MARCAINE     SPECIMEN:  Source of Specimen:  right breast tissue  DISPOSITION OF SPECIMEN:  PATHOLOGY  COUNTS:  YES  TOURNIQUET:  * No tourniquets in log *  DICTATION: .Dragon Dictation   After informed consent was obtained the patient was brought to the operating room and placed in the supine position on the operating table.  After adequate induction of general anesthesia the patient's right breast was prepped with ChloraPrep, allowed to dry, and draped in usual sterile manner.  An appropriate timeout was performed.  Previously an I-125 seed was placed in the upper central right breast to mark an area of discordance.  The neoprobe was set to I-125 in the area of radioactivity was readily identified.  The area around this was infiltrated with quarter percent Marcaine.  A curvilinear incision was made along the upper edge of the areola with a 15 blade knife.  The incision was carried through the skin and subcutaneous tissue sharply with the electrocautery.  Dissection was then carried out towards the radioactive seed under the direction of the neoprobe.  Once I more closely approached the radioactive seed I then removed a circular portion of breast tissue sharply with the electrocautery around the radioactive seed while checking the area of radioactivity frequently.  Once the specimen was removed it was oriented with the appropriate paint colors.  A specimen radiograph was obtained that showed the clip and  seed to be within the specimen.  The specimen was then sent to pathology for further evaluation.  Hemostasis was achieved using the Bovie electrocautery.  The cavity was irrigated with saline and infiltrated with more quarter percent Marcaine.  The deep layer of the wound was then closed with layers of interrupted 3-0 Vicryl stitches.  The skin was then closed with interrupted 4-0 Monocryl subcuticular stitches.  Dermabond dressings were applied.  The patient tolerated the procedure well.  At the end of the case all needle sponge and instrument counts were correct.  The patient was then awakened and taken recovery in stable condition.  PLAN OF CARE: Discharge to home after PACU  PATIENT DISPOSITION:  PACU - hemodynamically stable.   Delay start of Pharmacological VTE agent (>24hrs) due to surgical blood loss or risk of bleeding: not applicable

## 2019-08-26 NOTE — Transfer of Care (Signed)
Immediate Anesthesia Transfer of Care Note  Patient: Lori Allen  Procedure(s) Performed: RIGHT BREAST LUMPECTOMY WITH RADIOACTIVE SEED LOCALIZATION (Right Breast)  Patient Location: PACU  Anesthesia Type:General  Level of Consciousness: awake, alert  and oriented  Airway & Oxygen Therapy: Patient Spontanous Breathing and Patient connected to face mask oxygen  Post-op Assessment: Report given to RN and Post -op Vital signs reviewed and stable  Post vital signs: Reviewed and stable  Last Vitals:  Vitals Value Taken Time  BP 104/69 08/26/19 0925  Temp    Pulse 70 08/26/19 0928  Resp 18 08/26/19 0928  SpO2 98 % 08/26/19 0928  Vitals shown include unvalidated device data.  Last Pain:  Vitals:   08/26/19 0733  TempSrc: Oral  PainSc: 0-No pain         Complications: No apparent anesthesia complications

## 2019-08-26 NOTE — Anesthesia Procedure Notes (Signed)
Procedure Name: LMA Insertion Date/Time: 08/26/2019 8:35 AM Performed by: Lavonia Dana, CRNA Pre-anesthesia Checklist: Patient identified, Emergency Drugs available, Suction available and Patient being monitored Patient Re-evaluated:Patient Re-evaluated prior to induction Oxygen Delivery Method: Circle system utilized Preoxygenation: Pre-oxygenation with 100% oxygen Induction Type: IV induction Ventilation: Mask ventilation without difficulty LMA: LMA inserted LMA Size: 4.0 Number of attempts: 1 Airway Equipment and Method: Bite block Placement Confirmation: positive ETCO2 Tube secured with: Tape Dental Injury: Teeth and Oropharynx as per pre-operative assessment

## 2019-08-26 NOTE — Discharge Instructions (Signed)
  PT took tylenol at 0750 am. No tylenol products till after 2 pm today    Post Anesthesia Home Care Instructions  Activity: Get plenty of rest for the remainder of the day. A responsible individual must stay with you for 24 hours following the procedure.  For the next 24 hours, DO NOT: -Drive a car -Paediatric nurse -Drink alcoholic beverages -Take any medication unless instructed by your physician -Make any legal decisions or sign important papers.  Meals: Start with liquid foods such as gelatin or soup. Progress to regular foods as tolerated. Avoid greasy, spicy, heavy foods. If nausea and/or vomiting occur, drink only clear liquids until the nausea and/or vomiting subsides. Call your physician if vomiting continues.  Special Instructions/Symptoms: Your throat may feel dry or sore from the anesthesia or the breathing tube placed in your throat during surgery. If this causes discomfort, gargle with warm salt water. The discomfort should disappear within 24 hours.  If you had a scopolamine patch placed behind your ear for the management of post- operative nausea and/or vomiting:  1. The medication in the patch is effective for 72 hours, after which it should be removed.  Wrap patch in a tissue and discard in the trash. Wash hands thoroughly with soap and water. 2. You may remove the patch earlier than 72 hours if you experience unpleasant side effects which may include dry mouth, dizziness or visual disturbances. 3. Avoid touching the patch. Wash your hands with soap and water after contact with the patch.    Call your surgeon if you experience:   1.  Fever over 101.0. 2.  Inability to urinate. 3.  Nausea and/or vomiting. 4.  Extreme swelling or bruising at the surgical site. 5.  Continued bleeding from the incision. 6.  Increased pain, redness or drainage from the incision. 7.  Problems related to your pain medication. 8.  Any problems and/or concerns

## 2019-08-26 NOTE — Interval H&P Note (Signed)
History and Physical Interval Note:  08/26/2019 8:15 AM  Lori Allen  has presented today for surgery, with the diagnosis of RIGHT BREAST DISCORDANCE.  The various methods of treatment have been discussed with the patient and family. After consideration of risks, benefits and other options for treatment, the patient has consented to  Procedure(s): RIGHT BREAST LUMPECTOMY WITH RADIOACTIVE SEED LOCALIZATION (Right) as a surgical intervention.  The patient's history has been reviewed, patient examined, no change in status, stable for surgery.  I have reviewed the patient's chart and labs.  Questions were answered to the patient's satisfaction.     Autumn Messing III

## 2019-08-26 NOTE — Anesthesia Postprocedure Evaluation (Signed)
Anesthesia Post Note  Patient: Lori Allen  Procedure(s) Performed: RIGHT BREAST LUMPECTOMY WITH RADIOACTIVE SEED LOCALIZATION (Right Breast)     Patient location during evaluation: PACU Anesthesia Type: General Level of consciousness: awake and alert Pain management: pain level controlled Vital Signs Assessment: post-procedure vital signs reviewed and stable Respiratory status: spontaneous breathing, nonlabored ventilation, respiratory function stable and patient connected to nasal cannula oxygen Cardiovascular status: blood pressure returned to baseline and stable Postop Assessment: no apparent nausea or vomiting Anesthetic complications: no    Last Vitals:  Vitals:   08/26/19 0950 08/26/19 1000  BP:  109/65  Pulse: (!) 59 60  Resp: 14 13  Temp:    SpO2: 100% 97%    Last Pain:  Vitals:   08/26/19 0950  TempSrc:   PainSc: 0-No pain                 Lauraine Crespo S

## 2019-08-26 NOTE — Anesthesia Preprocedure Evaluation (Signed)
Anesthesia Evaluation  Patient identified by MRN, date of birth, ID band Patient awake    Reviewed: Allergy & Precautions, NPO status , Patient's Chart, lab work & pertinent test results  History of Anesthesia Complications (+) PONV  Airway Mallampati: II  TM Distance: >3 FB Neck ROM: Full    Dental no notable dental hx.    Pulmonary neg pulmonary ROS,    Pulmonary exam normal breath sounds clear to auscultation       Cardiovascular Normal cardiovascular exam Rhythm:Regular Rate:Normal     Neuro/Psych negative neurological ROS  negative psych ROS   GI/Hepatic negative GI ROS, Neg liver ROS,   Endo/Other  negative endocrine ROS  Renal/GU negative Renal ROS  negative genitourinary   Musculoskeletal negative musculoskeletal ROS (+)   Abdominal   Peds negative pediatric ROS (+)  Hematology negative hematology ROS (+)   Anesthesia Other Findings   Reproductive/Obstetrics negative OB ROS                             Anesthesia Physical Anesthesia Plan  ASA: II  Anesthesia Plan: General   Post-op Pain Management:    Induction: Intravenous  PONV Risk Score and Plan: 4 or greater and Ondansetron, Dexamethasone, Scopolamine patch - Pre-op, Midazolam and Treatment may vary due to age or medical condition  Airway Management Planned: LMA  Additional Equipment:   Intra-op Plan:   Post-operative Plan: Extubation in OR  Informed Consent: I have reviewed the patients History and Physical, chart, labs and discussed the procedure including the risks, benefits and alternatives for the proposed anesthesia with the patient or authorized representative who has indicated his/her understanding and acceptance.     Dental advisory given  Plan Discussed with: CRNA and Surgeon  Anesthesia Plan Comments:         Anesthesia Quick Evaluation

## 2019-08-30 ENCOUNTER — Encounter: Payer: Self-pay | Admitting: *Deleted

## 2019-08-30 ENCOUNTER — Other Ambulatory Visit: Payer: Self-pay

## 2019-09-01 LAB — SURGICAL PATHOLOGY

## 2020-09-05 ENCOUNTER — Telehealth: Payer: Self-pay

## 2020-09-05 NOTE — Telephone Encounter (Signed)
Called the requesting and spoke with [facility]. I asked if she or someone in medical records could fax most recent office note, EKG, recent labs and/or testing that may have been order to our office. Earney Navy stated that only an office note is what they have and will fax to our office.

## 2020-10-24 ENCOUNTER — Other Ambulatory Visit: Payer: Self-pay

## 2020-10-24 ENCOUNTER — Ambulatory Visit (INDEPENDENT_AMBULATORY_CARE_PROVIDER_SITE_OTHER): Payer: 59 | Admitting: Cardiovascular Disease

## 2020-10-24 ENCOUNTER — Encounter: Payer: Self-pay | Admitting: Cardiovascular Disease

## 2020-10-24 ENCOUNTER — Ambulatory Visit (INDEPENDENT_AMBULATORY_CARE_PROVIDER_SITE_OTHER): Payer: 59

## 2020-10-24 DIAGNOSIS — Z79899 Other long term (current) drug therapy: Secondary | ICD-10-CM | POA: Diagnosis not present

## 2020-10-24 DIAGNOSIS — F419 Anxiety disorder, unspecified: Secondary | ICD-10-CM

## 2020-10-24 DIAGNOSIS — R002 Palpitations: Secondary | ICD-10-CM

## 2020-10-24 DIAGNOSIS — I493 Ventricular premature depolarization: Secondary | ICD-10-CM | POA: Diagnosis not present

## 2020-10-24 DIAGNOSIS — E785 Hyperlipidemia, unspecified: Secondary | ICD-10-CM

## 2020-10-24 MED ORDER — METOPROLOL TARTRATE 25 MG PO TABS: 25.0000 mg | ORAL_TABLET | ORAL | 3 refills | Status: DC | PRN

## 2020-10-24 MED ORDER — ALPRAZOLAM 0.25 MG PO TABS: 0.2500 mg | ORAL_TABLET | Freq: Every evening | ORAL | 0 refills | Status: DC | PRN

## 2020-10-24 NOTE — Progress Notes (Unsigned)
Patient has [employer] which is not in network with [employer], (ZIO XT Patch).   Patient enrolled for [employer] to ship a 14 day long term holter monitor to her home. Patient notified via My Chart Message.

## 2020-10-24 NOTE — Progress Notes (Signed)
Cardiology Office Note    Date:  11/01/2020   ID:  Lori Allen, DOB 1960/07/16, MRN [id number]  PCP:  Nicoletta Dress, MD  Cardiologist:  Shelva Majestic, MD   New evaluation evaluation of palpitation   History of Present Illness:  Lori Allen is a 60 y.o. female who presents for new evaluation with me complaints of palpitations and occasional lightheadedness when she lays down.  Ms. Majer has a history of occasional palpitations and remotely had been cared by Dr. Geraldo Pitter for palpitations and had been treated with carvedilol.  In 2018, she began seeing Dr. Casimer Lanius for cardiology care and was taking carvedilol 3.125 mg twice a day.  She also was on atorvastatin 10 mg and was taking alprazolam as needed for anxiety.  She last saw Dr. Otho Perl in November 2020 for preoperative clearance prior to undergoing gynecologic surgery for cyst on her ovary and uterine fibroids.  Recently, she had run out of carvedilol.  She again had started to notice occasional episodes of chest fluttering so when she would lie down time she would feel "swimmy headed."She denies any syncope.  She denies any exertional precipitated chest pain.  She is aware of the PVCs when she is still.  Her former husband, and current boyfriend has seen me for cardiology care and she now presents to me for cardiology evaluation.  She has a history of hyperlipidemia, anxiety, as well as previous uterine fibroids and ovarian cyst.  She is status post hysterectomy and lap cholecystectomy.  She underwent right breast surgery by Dr. Jene Every for breast discordance in April 2021.  Past Medical History:  Diagnosis Date   Palpitations    infrequent , occurs with stress situation    PONV (postoperative nausea and vomiting)    PVC (premature ventricular contraction)    onset 2011      Past Surgical History:  Procedure Laterality Date   BREAST LUMPECTOMY WITH RADIOACTIVE SEED LOCALIZATION Right 08/26/2019   Procedure: RIGHT BREAST  LUMPECTOMY WITH RADIOACTIVE SEED LOCALIZATION;  Surgeon: Jovita Kussmaul, MD;  Location: [place];  Service: General;  Laterality: Right;   CHOLECYSTECTOMY  2012   [facility] , done laparoscopically   [place] N/A 04/26/2019   Procedure: LAPAROSCOPIC ASSISTED VAGINAL HYSTERECTOMY WITH SALPINGECTOMY RIGHT OOPHORECTOMY RIGHT OVARIAN CYSTECTOMY;  Surgeon: Jerelyn Charles, MD;  Location: [place];  Service: Gynecology;  Laterality: N/A;   LEEP  1990    Current Medications: Outpatient Medications Prior to Visit  Medication Sig Dispense Refill   atorvastatin (LIPITOR) 10 MG tablet Take 10 mg by mouth daily.     BIOTIN FORTE PO Take by mouth.     carvedilol (COREG) 3.125 MG tablet Take 3.125 mg by mouth 2 (two) times daily with a meal.     estradiol (VIVELLE-DOT) 0.075 MG/24HR 1 patch 2 (two) times a week.     fluticasone (FLONASE) 50 MCG/ACT nasal spray Place 1 spray into both nostrils daily.     Glucosamine 750 MG TABS Take by mouth.     ibuprofen (ADVIL) 800 MG tablet Take 1 tablet (800 mg total) by mouth every 8 (eight) hours. 30 tablet 0   levocetirizine (XYZAL) 5 MG tablet Take 5 mg by mouth daily as needed for allergies.      Multiple Vitamins-Minerals (MULTIVITAMIN WITH MINERALS) tablet Take 1 tablet by mouth daily.     Wheat Dextrin (BENEFIBER PO) Take by mouth.  ALPRAZolam (XANAX) 0.25 MG tablet Take 0.25 mg by mouth at bedtime as needed.     HYDROcodone-acetaminophen (NORCO/VICODIN) 5-325 MG tablet Take 1-2 tablets by mouth every 6 (six) hours as needed for moderate pain or severe pain. 10 tablet 0   oxyCODONE (OXY IR/ROXICODONE) 5 MG immediate release tablet Take 0.5-1 tablets (2.5-5 mg total) by mouth every 4 (four) hours as needed for severe pain. 10 tablet 0   No facility-administered medications prior to visit.     Allergies:   Patient has no known allergies.   Social History    Socioeconomic History   Marital status: Divorced    Spouse name: Not on file   Number of children: Not on file   Years of education: Not on file   Highest education level: Not on file  Occupational History   Not on file  Tobacco Use   Smoking status: Never   Smokeless tobacco: Never  Substance and Sexual Activity   Alcohol use: Yes    Comment: seldom    Drug use: Not on file   Sexual activity: Not on file  Other Topics Concern   Not on file  Social History Narrative   Not on file   Social Determinants of Health   Financial Resource Strain: Not on file  Food Insecurity: Not on file  Transportation Needs: Not on file  Physical Activity: Not on file  Stress: Not on file  Social Connections: Not on file    Socially, she was born in [place].  She completed 2 years of community college.  Initially she was married to [employer] her first husband at a young age.  She subsequently was married for 30 years to her second husband with subsequent divorce.  Recently she has been seeing her first husband again.  She is currently unemployed but previously had worked as a Water quality scientist. Not routinely exercising over the past 2 years.  She has 3 grandchildren.  Family History: Her father is 62 and has a history of atrial fibrillation.  Her mother is 20 and has elevated lipids.  She has 1 brother age 8 who smokes.  ROS General: Negative; No fevers, chills, or night sweats;  HEENT: Negative; No changes in vision or hearing, sinus congestion, difficulty swallowing Pulmonary: Negative; No cough, wheezing, shortness of breath, hemoptysis Cardiovascular: See HPI GI: Negative; No nausea, vomiting, diarrhea, or abdominal pain GU: Negative; No dysuria, hematuria, or difficulty voiding Musculoskeletal: Negative; no myalgias, joint pain, or weakness Hematologic/Oncology: Negative; no easy bruising, bleeding Endocrine: Negative; no heat/cold intolerance; no diabetes Neuro: Negative; no  changes in balance, headaches Skin: Negative; No rashes or skin lesions Psychiatric: Negative; No behavioral problems, depression Sleep: Sleeping well negative; No snoring, daytime sleepiness, hypersomnolence, bruxism, restless legs, hypnogognic hallucinations, no cataplexy Epworth Sleepiness score 1 arguing against daytime sleepiness Other comprehensive 14 point system review is negative.   PHYSICAL EXAM:   VS:  BP 140/80 (BP Location: Right Arm)   Pulse (!) 57   Ht 5' 2"  (1.575 m)   Wt 145 lb 6.4 oz (66 kg)   LMP  (LMP Unknown) Comment: greater than 1 year since lmp   BMI 26.59 kg/m     Repeat blood pressure by me 122/70 supine  Wt Readings from Last 3 Encounters:  10/24/20 145 lb 6.4 oz (66 kg)  08/26/19 150 lb 12.7 oz (68.4 kg)  04/26/19 153 lb 9.6 oz (69.7 kg)    General: Alert, oriented, no distress.  Skin: normal turgor, no  rashes, warm and dry HEENT: Normocephalic, atraumatic. Pupils equal round and reactive to light; sclera anicteric; extraocular muscles intact;  Nose without nasal septal hypertrophy Mouth/Parynx benign; Mallinpatti scale 3 Neck: No JVD, no carotid bruits; normal carotid upstroke Lungs: clear to ausculatation and percussion; no wheezing or rales Chest wall: without tenderness to palpitation Heart: PMI not displaced, RRR, s1 s2 normal, 1/6 systolic murmur, no diastolic murmur, no rubs, gallops, thrills, or heaves Abdomen: soft, nontender; no hepatosplenomehaly, BS+; abdominal aorta nontender and not dilated by palpation. Back: no CVA tenderness Pulses 2+ Musculoskeletal: full range of motion, normal strength, no joint deformities Extremities: no clubbing cyanosis or edema, Homan's sign negative  Neurologic: grossly nonfocal; Cranial nerves grossly wnl Psychologic: Normal mood and affect   Studies/Labs Reviewed:   EKG:  EKG is ordered today.  ECG (independently read by me):  Sinus bardycardia at 57; no ectopy; QTc 408 msec  Recent Labs: BMP  Latest Ref Rng & Units 10/25/2020 04/22/2019  Glucose 65 - 99 mg/dL 93 111(H)  BUN 6 - 24 mg/dL 9 10  Creatinine 0.57 - 1.00 mg/dL 0.67 0.61  BUN/Creat Ratio 9 - 23 13 -  Sodium 134 - 144 mmol/L 140 143  Potassium 3.5 - 5.2 mmol/L 4.0 3.9  Chloride 96 - 106 mmol/L 102 108  CO2 20 - 29 mmol/L 21 25  Calcium 8.7 - 10.2 mg/dL 9.3 9.7     Hepatic Function Latest Ref Rng & Units 10/25/2020  Total Protein 6.0 - 8.5 g/dL 6.6  Albumin 3.8 - 4.9 g/dL 4.6  AST 0 - 40 IU/L 60(H)  ALT 0 - 32 IU/L 86(H)  Alk Phosphatase 44 - 121 IU/L 77  Total Bilirubin 0.0 - 1.2 mg/dL 0.5    CBC Latest Ref Rng & Units 10/25/2020 04/27/2019 04/22/2019  WBC 3.4 - 10.8 x10E3/uL 7.7 11.4(H) 6.9  Hemoglobin 11.1 - 15.9 g/dL 14.3 12.0 14.4  Hematocrit 34.0 - 46.6 % 42.2 37.3 43.5  Platelets 150 - 450 x10E3/uL 238 196 226   Lab Results  Component Value Date   MCV 92 10/25/2020   MCV 96.9 04/27/2019   MCV 94.6 04/22/2019   Lab Results  Component Value Date   TSH 4.310 10/25/2020   No results found for: HGBA1C   BNP No results found for: BNP  ProBNP No results found for: PROBNP   Lipid Panel     Component Value Date/Time   CHOL 283 (H) 10/25/2020 1042   TRIG 363 (H) 10/25/2020 1042   HDL 42 10/25/2020 1042   CHOLHDL 6.7 (H) 10/25/2020 1042   LDLCALC 171 (H) 10/25/2020 1042   LABVLDL 70 (H) 10/25/2020 1042     RADIOLOGY: No results found.   Additional studies/ records that were reviewed today include:  I reviewed the records from 2020 of Dr. Casimer Lanius    ASSESSMENT:    1. Palpitations   2. Medication management   3. Anxiety   4. Symptomatic PVCs   5. Hyperlipidemia, unspecified hyperlipidemia type     PLAN:  Ms. Braelynn Benning is a 60 year old female who presents to the office today to establish cardiology care with me.  She has a history of symptomatic PVCs in the past and remotely had been seen by Dr. Geraldo Pitter and more recently by Dr.Folk.  Remotely she had been on  carvedilol 8.125 mg twice a day and recently had run out of this medication.  Recently she has noticed episodes of chest fluttering at times she admits to feeling "swimmy  headed."  She denies any exertional chest pain.  However she admits that over the past several years she has not been very active.  Presently, she has not had recent laboratory and I am recommending she undergo comprehensive metabolic panel, CBC, TSH, fasting lipids and magnesium level.  I am scheduling her for 2D echo Doppler study to evaluate systolic and diastolic function.  I have recommended that she wear a 2-week Zio patch monitor for further assessment of her palpitations with her history of PVCs.  ECG today shows sinus bradycardia at 57 beats per minute without ectopy and with normal intervals.  I will give her a prescription for metoprolol tartrate to take 25 mg on a as needed basis if she has episodic palpitations but I will not start her on daily therapy presently.  We will contact her regarding the results of her laboratory.  Initially she had been on atorvastatin but had run out.  Adjustments will be made accordingly.  In 6 weeks for reevaluation or sooner as needed.   Medication Adjustments/Labs and Tests Ordered: Current medicines are reviewed at length with the patient today.  Concerns regarding medicines are outlined above.  Medication changes, Labs and Tests ordered today are listed in the Patient Instructions below. Patient Instructions  Medication Instructions:  Take metoprolol tartrate 51m AS NEEDED for palpitations.   *If you need a refill on your cardiac medications before your next appointment, please call your pharmacy*   Lab Work: FASTING Cmet, CBC, Lipid, TSH, Magnesium.   If you have labs (blood work) drawn today and your tests are completely normal, you will receive your results only by: MParmer(if you have MyChart) OR A paper copy in the mail If you have any lab test that is abnormal or we  need to change your treatment, we will call you to review the results.   Testing/Procedures: Your physician has requested that you have an echocardiogram. Echocardiography is a painless test that uses sound waves to create images of your heart. It provides your doctor with information about the size and shape of your heart and how well your heart's chambers and valves are working. This procedure takes approximately one hour. There are no restrictions for this procedure.    Follow-Up: At [facility] you and your health needs are our priority.  As part of our continuing mission to provide you with exceptional heart care, we have created designated Provider Care Teams.  These Care Teams include your primary Cardiologist (physician) and Advanced Practice Providers (APPs -  Physician Assistants and Nurse Practitioners) who all work together to provide you with the care you need, when you need it.  We recommend signing up for the patient portal called "MyChart".  Sign up information is provided on this After Visit Summary.  MyChart is used to connect with patients for Virtual Visits (Telemedicine).  Patients are able to view lab/test results, encounter notes, upcoming appointments, etc.  Non-urgent messages can be sent to your provider as well.   To learn more about what you can do with MyChart, go to [email or web address]    Your next appointment:   6 week(s)  The format for your next appointment:   In Person  Provider:   TShelva Majestic MD   Other Instructions [employer] Long Term Monitor Instructions  Your physician has requested you wear a ZIO patch monitor for 14 days.  This is a single patch monitor. [employer] supplies one patch monitor per enrollment. Additional stickers are not available.  Please do not apply patch if you will be having a Nuclear Stress Test,  Echocardiogram, Cardiac CT, MRI, or Chest Xray during the period you would be wearing the  monitor. The patch cannot be worn  during these tests. You cannot remove and re-apply the  ZIO XT patch monitor.  Your ZIO patch monitor will be mailed 3 day USPS to your address on file. It may take 3-5 days  to receive your monitor after you have been enrolled.  Once you have received your monitor, please review the enclosed instructions. Your monitor  has already been registered assigning a specific monitor serial # to you.  Billing and Patient Assistance Program Information  We have supplied [employer] with any of your insurance information on file for billing purposes. [employer] offers a sliding scale Patient Assistance Program for patients that do not have  insurance, or whose insurance does not completely cover the cost of the ZIO monitor.  You must apply for the Patient Assistance Program to qualify for this discounted rate.  To apply, please call [employer] at [phone], select option 4, select option 2, ask to apply for  Patient Assistance Program. Theodore Demark will ask your household income, and how many people  are in your household. They will quote your out-of-pocket cost based on that information.  [employer] will also be able to set up a 39-month interest-free payment plan if needed.  Applying the monitor   Shave hair from upper left chest.  Hold abrader disc by orange tab. Rub abrader in 40 strokes over the upper left chest as  indicated in your monitor instructions.  Clean area with 4 enclosed alcohol pads. Let dry.  Apply patch as indicated in monitor instructions. Patch will be placed under collarbone on left  side of chest with arrow pointing upward.  Rub patch adhesive wings for 2 minutes. Remove white label marked "1". Remove the white  label marked "2". Rub patch adhesive wings for 2 additional minutes.  While looking in a mirror, press and release button in center of patch. A small green light will  flash 3-4 times. This will be your only indicator that the monitor has been turned on.  Do not shower for the  first 24 hours. You may shower after the first 24 hours.  Press the button if you feel a symptom. You will hear a small click. Record Date, Time and  Symptom in the Patient Logbook.  When you are ready to remove the patch, follow instructions on the last 2 pages of Patient  Logbook. Stick patch monitor onto the last page of Patient Logbook.  Place Patient Logbook in the blue and white box. Use locking tab on box and tape box closed  securely. The blue and white box has prepaid postage on it. Please place it in the mailbox as  soon as possible. Your physician should have your test results approximately 7 days after the  monitor has [facility]  Call IMonte[place]umaat [phone] you have questions regarding  your ZIO XT patch monitor. Call them immediately if you see an orange light blinking on your  monitor.  If your monitor falls off in less than 4 days, contact our Monitor department at [phone]  If your monitor becomes loose or falls off after 4 days call [employer] at [phone]  suggestions on securing your monitor    Signed, TShelva Majestic MD  11/01/2020 12:09 PM    [facility]  [place], [place], [place], [place]  [address] Phone: [phone]

## 2020-10-24 NOTE — Patient Instructions (Addendum)
Medication Instructions:  Take metoprolol tartrate 25mg  AS NEEDED for palpitations.   *If you need a refill on your cardiac medications before your next appointment, please call your pharmacy*   Lab Work: FASTING Cmet, CBC, Lipid, TSH, Magnesium.   If you have labs (blood work) drawn today and your tests are completely normal, you will receive your results only by: [place] (if you have MyChart) OR A paper copy in the mail If you have any lab test that is abnormal or we need to change your treatment, we will call you to review the results.   Testing/Procedures: Your physician has requested that you have an echocardiogram. Echocardiography is a painless test that uses sound waves to create images of your heart. It provides your doctor with information about the size and shape of your heart and how well your heart's chambers and valves are working. This procedure takes approximately one hour. There are no restrictions for this procedure.    Follow-Up: At [facility], you and your health needs are our priority.  As part of our continuing mission to provide you with exceptional heart care, we have created designated Provider Care Teams.  These Care Teams include your primary Cardiologist (physician) and Advanced Practice Providers (APPs -  Physician Assistants and Nurse Practitioners) who all work together to provide you with the care you need, when you need it.  We recommend signing up for the patient portal called "MyChart".  Sign up information is provided on this After Visit Summary.  MyChart is used to connect with patients for Virtual Visits (Telemedicine).  Patients are able to view lab/test results, encounter notes, upcoming appointments, etc.  Non-urgent messages can be sent to your provider as well.   To learn more about what you can do with MyChart, go to [email or web address].    Your next appointment:   6 week(s)  The format for your next appointment:   In  Person  Provider:   Shelva Majestic, MD   Other Instructions Bryn Gulling- Long Term Monitor Instructions  Your physician has requested you wear a ZIO patch monitor for 14 days.  This is a single patch monitor. [employer] supplies one patch monitor per enrollment. Additional stickers are not available. Please do not apply patch if you will be having a Nuclear Stress Test,  Echocardiogram, Cardiac CT, MRI, or Chest Xray during the period you would be wearing the  monitor. The patch cannot be worn during these tests. You cannot remove and re-apply the  ZIO XT patch monitor.  Your ZIO patch monitor will be mailed 3 day USPS to your address on file. It may take 3-5 days  to receive your monitor after you have been enrolled.  Once you have received your monitor, please review the enclosed instructions. Your monitor  has already been registered assigning a specific monitor serial # to you.  Billing and Patient Assistance Program Information  We have supplied [employer] with any of your insurance information on file for billing purposes. [employer] offers a sliding scale Patient Assistance Program for patients that do not have  insurance, or whose insurance does not completely cover the cost of the ZIO monitor.  You must apply for the Patient Assistance Program to qualify for this discounted rate.  To apply, please call [employer] at [phone], select option 4, select option 2, ask to apply for  Patient Assistance Program. Theodore Demark will ask your household income, and how many people  are in your household. They will quote your out-of-pocket  cost based on that information.  [employer] will also be able to set up a 74-month, interest-free payment plan if needed.  Applying the monitor   Shave hair from upper left chest.  Hold abrader disc by orange tab. Rub abrader in 40 strokes over the upper left chest as  indicated in your monitor instructions.  Clean area with 4 enclosed alcohol pads. Let dry.  Apply patch  as indicated in monitor instructions. Patch will be placed under collarbone on left  side of chest with arrow pointing upward.  Rub patch adhesive wings for 2 minutes. Remove white label marked "1". Remove the white  label marked "2". Rub patch adhesive wings for 2 additional minutes.  While looking in a mirror, press and release button in center of patch. A small green light will  flash 3-4 times. This will be your only indicator that the monitor has been turned on.  Do not shower for the first 24 hours. You may shower after the first 24 hours.  Press the button if you feel a symptom. You will hear a small click. Record Date, Time and  Symptom in the Patient Logbook.  When you are ready to remove the patch, follow instructions on the last 2 pages of Patient  Logbook. Stick patch monitor onto the last page of Patient Logbook.  Place Patient Logbook in the blue and white box. Use locking tab on box and tape box closed  securely. The blue and white box has prepaid postage on it. Please place it in the mailbox as  soon as possible. Your physician should have your test results approximately 7 days after the  monitor has been mailed back to [facility].  Call [facility] at [phone] if you have questions regarding  your ZIO XT patch monitor. Call them immediately if you see an orange light blinking on your  monitor.  If your monitor falls off in less than 4 days, contact our Monitor department at [phone].  If your monitor becomes loose or falls off after 4 days call [employer] at [phone] for  suggestions on securing your monitor

## 2020-10-25 LAB — TSH: TSH: 4.31 u[IU]/mL (ref 0.450–4.500)

## 2020-10-25 LAB — COMPREHENSIVE METABOLIC PANEL
ALT: 86 IU/L — ABNORMAL HIGH (ref 0–32)
AST: 60 IU/L — ABNORMAL HIGH (ref 0–40)
Albumin/Globulin Ratio: 2.3 — ABNORMAL HIGH (ref 1.2–2.2)
Albumin: 4.6 g/dL (ref 3.8–4.9)
Alkaline Phosphatase: 77 IU/L (ref 44–121)
BUN/Creatinine Ratio: 13 (ref 9–23)
BUN: 9 mg/dL (ref 6–24)
Bilirubin Total: 0.5 mg/dL (ref 0.0–1.2)
CO2: 21 mmol/L (ref 20–29)
Calcium: 9.3 mg/dL (ref 8.7–10.2)
Chloride: 102 mmol/L (ref 96–106)
Creatinine, Ser: 0.67 mg/dL (ref 0.57–1.00)
Globulin, Total: 2 g/dL (ref 1.5–4.5)
Glucose: 93 mg/dL (ref 65–99)
Potassium: 4 mmol/L (ref 3.5–5.2)
Sodium: 140 mmol/L (ref 134–144)
Total Protein: 6.6 g/dL (ref 6.0–8.5)
eGFR: 101 mL/min/{1.73_m2} (ref >59)

## 2020-10-25 LAB — CBC
Hematocrit: 42.2 % (ref 34.0–46.6)
Hemoglobin: 14.3 g/dL (ref 11.1–15.9)
MCH: 31.2 pg (ref 26.6–33.0)
MCHC: 33.9 g/dL (ref 31.5–35.7)
MCV: 92 fL (ref 79–97)
Platelets: 238 10*3/uL (ref 150–450)
RBC: 4.59 x10E6/uL (ref 3.77–5.28)
RDW: 11.9 % (ref 11.7–15.4)
WBC: 7.7 10*3/uL (ref 3.4–10.8)

## 2020-10-25 LAB — LIPID PANEL
Chol/HDL Ratio: 6.7 ratio — ABNORMAL HIGH (ref 0.0–4.4)
Cholesterol, Total: 283 mg/dL — ABNORMAL HIGH (ref 100–199)
HDL: 42 mg/dL (ref >39)
LDL Chol Calc (NIH): 171 mg/dL — ABNORMAL HIGH (ref 0–99)
Triglycerides: 363 mg/dL — ABNORMAL HIGH (ref 0–149)
VLDL Cholesterol Cal: 70 mg/dL — ABNORMAL HIGH (ref 5–40)

## 2020-10-25 LAB — MAGNESIUM: Magnesium: 1.9 mg/dL (ref 1.6–2.3)

## 2020-10-29 DIAGNOSIS — R002 Palpitations: Secondary | ICD-10-CM

## 2020-11-01 ENCOUNTER — Encounter: Payer: Self-pay | Admitting: Cardiovascular Disease

## 2020-11-06 ENCOUNTER — Encounter (HOSPITAL_COMMUNITY): Payer: Self-pay

## 2020-11-06 ENCOUNTER — Telehealth: Payer: Self-pay | Admitting: Cardiovascular Disease

## 2020-11-06 NOTE — Telephone Encounter (Signed)
Reviewed available options with patient.  She elected to remove monitor for a few days to see if irritation subsides.  If it does she can reapply monitor to try to get a couple more days of data.  If she cannot tolerate, simply remove monitor and ship it back to [employer] for processing.

## 2020-11-06 NOTE — Telephone Encounter (Signed)
New Message:     Pt says she have been wearing her Monitor for a week. She says she is allergic to the adhesive. She wants to know if she can just stopping wearing it, since it have already been a week. She was supposed to have worn it for 2 weeks.

## 2020-11-09 MED ORDER — ATORVASTATIN CALCIUM 10 MG PO TABS: 10.0000 mg | ORAL_TABLET | Freq: Every day | ORAL | 1 refills | Status: DC

## 2020-11-09 NOTE — Addendum Note (Signed)
Addended by: Caprice Beaver T on: 11/09/2020 12:59 PM   Modules accepted: Orders

## 2020-11-15 ENCOUNTER — Other Ambulatory Visit: Payer: Self-pay | Admitting: *Deleted

## 2020-11-15 DIAGNOSIS — E785 Hyperlipidemia, unspecified: Secondary | ICD-10-CM

## 2020-11-15 DIAGNOSIS — R748 Abnormal levels of other serum enzymes: Secondary | ICD-10-CM

## 2020-11-15 MED ORDER — ICOSAPENT ETHYL 1 G PO CAPS: 2.0000 g | ORAL_CAPSULE | Freq: Two times a day (BID) | ORAL | 11 refills | Status: DC

## 2020-11-15 MED ORDER — EZETIMIBE 10 MG PO TABS: 10.0000 mg | ORAL_TABLET | Freq: Every day | ORAL | 3 refills | Status: DC

## 2020-11-19 ENCOUNTER — Other Ambulatory Visit (HOSPITAL_COMMUNITY): Payer: 59

## 2020-11-29 ENCOUNTER — Other Ambulatory Visit (HOSPITAL_COMMUNITY): Payer: 59

## 2020-12-04 ENCOUNTER — Ambulatory Visit (HOSPITAL_COMMUNITY): Payer: 59 | Attending: Internal Medicine

## 2020-12-04 ENCOUNTER — Other Ambulatory Visit: Payer: Self-pay

## 2020-12-04 DIAGNOSIS — R002 Palpitations: Secondary | ICD-10-CM | POA: Diagnosis present

## 2020-12-04 LAB — ECHOCARDIOGRAM COMPLETE
Area-P 1/2: 3.27 cm2
S' Lateral: 2.8 cm

## 2020-12-05 ENCOUNTER — Encounter: Payer: Self-pay | Admitting: Cardiovascular Disease

## 2020-12-05 ENCOUNTER — Ambulatory Visit (INDEPENDENT_AMBULATORY_CARE_PROVIDER_SITE_OTHER): Payer: 59 | Admitting: Cardiovascular Disease

## 2020-12-05 DIAGNOSIS — E782 Mixed hyperlipidemia: Secondary | ICD-10-CM

## 2020-12-05 DIAGNOSIS — R002 Palpitations: Secondary | ICD-10-CM

## 2020-12-05 DIAGNOSIS — R7989 Other specified abnormal findings of blood chemistry: Secondary | ICD-10-CM

## 2020-12-05 DIAGNOSIS — I493 Ventricular premature depolarization: Secondary | ICD-10-CM | POA: Diagnosis not present

## 2020-12-05 DIAGNOSIS — F419 Anxiety disorder, unspecified: Secondary | ICD-10-CM

## 2020-12-05 MED ORDER — OMEGA-3-ACID ETHYL ESTERS 1 G PO CAPS: 1.0000 g | ORAL_CAPSULE | Freq: Two times a day (BID) | ORAL | 3 refills | Status: DC

## 2020-12-05 MED ORDER — ATORVASTATIN CALCIUM 10 MG PO TABS: 5.0000 mg | ORAL_TABLET | Freq: Every day | ORAL | Status: DC

## 2020-12-05 NOTE — Progress Notes (Signed)
Cardiology Office Note    Date:  12/05/2020   ID:  Lori Allen, DOB 1960/12/25, MRN [id number]  PCP:  Nicoletta Dress, MD  Cardiologist:  Shelva Majestic, MD   6 week F/U evaluation    History of Present Illness:  Lori Allen is a 60 y.o. female who presents for a 6-week follow-up evaluation.  Lori Allen has a history of occasional palpitations and remotely had been cared by Dr. Geraldo Pitter for palpitations and had been treated with carvedilol.  In 2018, she began seeing Dr. Casimer Lanius for cardiology care and was taking carvedilol 3.125 mg twice a day.  She also was on atorvastatin 10 mg and was taking alprazolam as needed for anxiety.  She last saw Dr. Otho Perl in November 2020 for preoperative clearance prior to undergoing gynecologic surgery for cyst on her ovary and uterine fibroids.  For initial evaluation on October 24, 2020.  Prior to that evaluation she had run out of carvedilol and she again started to notice occasional episodes of chest fluttering that when she would lie down she would feel "swimmy headed."   She denied any exertional precipitated chest pain.  She is aware of the PVCs when she is still.  Her former husband, and current boyfriend has seen me for cardiology care and she now presents to me for cardiology evaluation.  She has a history of hyperlipidemia, anxiety, as well as previous uterine fibroids and ovarian cyst.  She is status post hysterectomy and lap cholecystectomy.  She underwent right breast surgery by Dr. Jene Every for breast discordance in April 2021.  During her initial evaluation, she was bradycardic and did not have ectopy on ECG.  She had been off the carvedilol for some time.  I recommended she undergo a 2D echo Doppler study and scheduled her for a 2-week monitor.  I also gave her a prescription for metoprolol to tartrate 25 mg to take on a as needed basis.  She is taking this medication on 1 occasion.  Her long-term monitor from June 20 until only June 28  showed predominant sinus rhythm.  She developed a rash and therefore did not wear the monitor for the entire 2 weeks duration.  There were no episodes of atrial fibrillation or atrial flutter.  Slowest heart rate was sinus bradycardia at 44 and fastest heart rate was sinus tachycardia at 122 bpm.  There was a very rare isolated PAC.  There also were occasional PVCs with total ventricular ectopy at 2.38%.  Most were isolated.  She had several episodes of ventricular bigeminy and trigeminy and total of 4 couplets representing less than 0.01%.  An echo Doppler study on December 04, 2020 showed an EF of 60 to 65% with grade 1 diastolic dysfunction.  Valves were normal.  Laboratory from her vision evaluation showed a normal magnesium and thyroid stimulating hormone.  Chemistry was normal with the exception of LFT elevation with AST at 60 and ALT at 86.  Lipid studies were abnormal with total cholesterol 283, triglycerides 363, VLDL cholesterol 70, and LDL cholesterol at 171.  At that time, I recommended initiation of Zetia and Vascepa 2 capsules twice a day.  She apparently had been previously prescribed atorvastatin 10 mg.  Since her laboratory she has been much more aware of try to shop less for saturated and transfatty products.  She feels well.  Her palpitations have improved but at times there is a occasional skip which seems more when she is lying flat.  He presents for follow-up evaluation.  Past Medical History:  Diagnosis Date   Palpitations    infrequent , occurs with stress situation    PONV (postoperative nausea and vomiting)    PVC (premature ventricular contraction)    onset 2011      Past Surgical History:  Procedure Laterality Date   BREAST LUMPECTOMY WITH RADIOACTIVE SEED LOCALIZATION Right 08/26/2019   Procedure: RIGHT BREAST LUMPECTOMY WITH RADIOACTIVE SEED LOCALIZATION;  Surgeon: Jovita Kussmaul, MD;  Location: [facility];  Service: General;  Laterality: Right;    CHOLECYSTECTOMY  2012   [place] [facility] , done laparoscopically   [place] N/A 04/26/2019   Procedure: LAPAROSCOPIC ASSISTED VAGINAL HYSTERECTOMY WITH SALPINGECTOMY RIGHT OOPHORECTOMY RIGHT OVARIAN CYSTECTOMY;  Surgeon: Jerelyn Charles, MD;  Location: [facility];  Service: Gynecology;  Laterality: N/A;   LEEP  1990    Current Medications: Outpatient Medications Prior to Visit  Medication Sig Dispense Refill   ALPRAZolam (XANAX) 0.25 MG tablet Take 1 tablet (0.25 mg total) by mouth at bedtime as needed. 30 tablet 0   BIOTIN FORTE PO Take by mouth.     estradiol (VIVELLE-DOT) 0.075 MG/24HR 1 patch 2 (two) times a week.     ezetimibe (ZETIA) 10 MG tablet Take 1 tablet (10 mg total) by mouth daily. 90 tablet 3   fluticasone (FLONASE) 50 MCG/ACT nasal spray Place 1 spray into both nostrils daily.     Glucosamine 750 MG TABS Take by mouth.     icosapent Ethyl (VASCEPA) 1 g capsule Take 2 capsules (2 g total) by mouth 2 (two) times daily. 120 capsule 11   levocetirizine (XYZAL) 5 MG tablet Take 5 mg by mouth daily as needed for allergies.      metoprolol tartrate (LOPRESSOR) 25 MG tablet Take 1 tablet (25 mg total) by mouth as needed (For palpitations.). 30 tablet 3   Multiple Vitamins-Minerals (MULTIVITAMIN WITH MINERALS) tablet Take 1 tablet by mouth daily.     Wheat Dextrin (BENEFIBER PO) Take by mouth.     atorvastatin (LIPITOR) 10 MG tablet Take 1 tablet (10 mg total) by mouth daily. 90 tablet 1   carvedilol (COREG) 3.125 MG tablet Take 3.125 mg by mouth 2 (two) times daily with a meal.     ibuprofen (ADVIL) 800 MG tablet Take 1 tablet (800 mg total) by mouth every 8 (eight) hours. (Patient not taking: Reported on 12/05/2020) 30 tablet 0   No facility-administered medications prior to visit.     Allergies:   Patient has no known allergies.   Social History   Socioeconomic History   Marital status: Divorced    Spouse  name: Not on file   Number of children: Not on file   Years of education: Not on file   Highest education level: Not on file  Occupational History   Not on file  Tobacco Use   Smoking status: Never   Smokeless tobacco: Never  Substance and Sexual Activity   Alcohol use: Yes    Comment: seldom    Drug use: Not on file   Sexual activity: Not on file  Other Topics Concern   Not on file  Social History Narrative   Not on file   Social Determinants of Health   Financial Resource Strain: Not on file  Food Insecurity: Not on file  Transportation Needs: Not on file  Physical Activity: Not on file  Stress: Not on file  Social Connections: Not on  file    Socially, she was born in [place].  She completed 2 years of community college.  Initially she was married to [employer] her first husband at a young age.  She subsequently was married for 30 years to her second husband with subsequent divorce.  Recently she has been seeing her first husband again.  She is currently unemployed but previously had worked as a Water quality scientist. Not routinely exercising over the past 2 years.  She has 3 grandchildren.  Family History: Her father is 81 and has a history of atrial fibrillation.  Her mother is 57 and has elevated lipids.  She has 1 brother age 13 who smokes.  ROS General: Negative; No fevers, chills, or night sweats;  HEENT: Negative; No changes in vision or hearing, sinus congestion, difficulty swallowing Pulmonary: Negative; No cough, wheezing, shortness of breath, hemoptysis Cardiovascular: See HPI GI: Negative; No nausea, vomiting, diarrhea, or abdominal pain GU: Negative; No dysuria, hematuria, or difficulty voiding Musculoskeletal: Negative; no myalgias, joint pain, or weakness Hematologic/Oncology: Negative; no easy bruising, bleeding Endocrine: Negative; no heat/cold intolerance; no diabetes Neuro: Negative; no changes in balance, headaches Skin: Negative; No rashes or skin  lesions Psychiatric: Negative; No behavioral problems, depression Sleep: Sleeping well negative; No snoring, daytime sleepiness, hypersomnolence, bruxism, restless legs, hypnogognic hallucinations, no cataplexy Epworth Sleepiness score 1 arguing against daytime sleepiness Other comprehensive 14 point system review is negative.   PHYSICAL EXAM:   VS:  BP 132/72   Pulse (!) 52   Ht 5' 2"  (1.575 m)   Wt 144 lb 3.2 oz (65.4 kg)   LMP  (LMP Unknown) Comment: greater than 1 year since lmp   SpO2 99%   BMI 26.37 kg/m      Wt Readings from Last 3 Encounters:  12/05/20 144 lb 3.2 oz (65.4 kg)  10/24/20 145 lb 6.4 oz (66 kg)  08/26/19 150 lb 12.7 oz (68.4 kg)    General: Alert, oriented, no distress.  Skin: normal turgor, no rashes, warm and dry HEENT: Normocephalic, atraumatic. Pupils equal round and reactive to light; sclera anicteric; extraocular muscles intact;  Nose without nasal septal hypertrophy Mouth/Parynx benign; Mallinpatti scale 3 Neck: No JVD, no carotid bruits; normal carotid upstroke Lungs: clear to ausculatation and percussion; no wheezing or rales Chest wall: without tenderness to palpitation Heart: PMI not displaced, RRR, s1 s2 normal, 1/6 systolic murmur, no diastolic murmur, no rubs, gallops, thrills, or heaves Abdomen: soft, nontender; no hepatosplenomehaly, BS+; abdominal aorta nontender and not dilated by palpation. Back: no CVA tenderness Pulses 2+ Musculoskeletal: full range of motion, normal strength, no joint deformities Extremities: no clubbing cyanosis or edema, Homan's sign negative  Neurologic: grossly nonfocal; Cranial nerves grossly wnl Psychologic: Normal mood and affect   Studies/Labs Reviewed:   EKG:  EKG is ordered today.  ECG (independently read by me): Sinus bradycardia at 52 bpm.  No ectopy.  Normal intervals.    October 24, 2020 ECG (independently read by me):  Sinus bardycardia at 57; no ectopy; QTc 408 msec  Recent Labs: BMP Latest Ref  Rng & Units 10/25/2020 04/22/2019  Glucose 65 - 99 mg/dL 93 111(H)  BUN 6 - 24 mg/dL 9 10  Creatinine 0.57 - 1.00 mg/dL 0.67 0.61  BUN/Creat Ratio 9 - 23 13 -  Sodium 134 - 144 mmol/L 140 143  Potassium 3.5 - 5.2 mmol/L 4.0 3.9  Chloride 96 - 106 mmol/L 102 108  CO2 20 - 29 mmol/L 21 25  Calcium 8.7 -  10.2 mg/dL 9.3 9.7     Hepatic Function Latest Ref Rng & Units 10/25/2020  Total Protein 6.0 - 8.5 g/dL 6.6  Albumin 3.8 - 4.9 g/dL 4.6  AST 0 - 40 IU/L 60(H)  ALT 0 - 32 IU/L 86(H)  Alk Phosphatase 44 - 121 IU/L 77  Total Bilirubin 0.0 - 1.2 mg/dL 0.5    CBC Latest Ref Rng & Units 10/25/2020 04/27/2019 04/22/2019  WBC 3.4 - 10.8 x10E3/uL 7.7 11.4(H) 6.9  Hemoglobin 11.1 - 15.9 g/dL 14.3 12.0 14.4  Hematocrit 34.0 - 46.6 % 42.2 37.3 43.5  Platelets 150 - 450 x10E3/uL 238 196 226   Lab Results  Component Value Date   MCV 92 10/25/2020   MCV 96.9 04/27/2019   MCV 94.6 04/22/2019   Lab Results  Component Value Date   TSH 4.310 10/25/2020   No results found for: HGBA1C   BNP No results found for: BNP  ProBNP No results found for: PROBNP   Lipid Panel     Component Value Date/Time   CHOL 283 (H) 10/25/2020 1042   TRIG 363 (H) 10/25/2020 1042   HDL 42 10/25/2020 1042   CHOLHDL 6.7 (H) 10/25/2020 1042   LDLCALC 171 (H) 10/25/2020 1042   LABVLDL 70 (H) 10/25/2020 1042     RADIOLOGY: ECHOCARDIOGRAM COMPLETE  Result Date: 12/04/2020    ECHOCARDIOGRAM REPORT   Patient Name:   Lori Allen  Date of Exam: 12/04/2020 Medical Rec #:  [id number]     Height:       62.0 in Accession #:    [id number]    Weight:       145.4 lb Date of Birth:  05-Mar-1961      BSA:          1.669 m Patient Age:    74 years      BP:           140/80 mmHg Patient Gender: F             HR:           55 bpm. Exam Location:  [address] Procedure: 3D Echo, 2D Echo, Cardiac Doppler, Color Doppler and Strain Analysis Indications:    R00.2 Palpitation  History:        Patient has no prior history of  Echocardiogram examinations.                 Arrythmias:PVC.  Sonographer:    Jessee Avers, RDCS Referring Phys: [place]  1. Left ventricular ejection fraction, by estimation, is 60 to 65%. The left ventricle has normal function. The left ventricle has no regional wall motion abnormalities. Left ventricular diastolic parameters are consistent with Grade I diastolic dysfunction (impaired relaxation).  2. Right ventricular systolic function is normal. The right ventricular size is normal. There is normal pulmonary artery systolic pressure. The estimated right ventricular systolic pressure is 49.1 mmHg.  3. The mitral valve is grossly normal. Trivial mitral valve regurgitation.  4. The aortic valve is tricuspid. Aortic valve regurgitation is not visualized.  5. The inferior vena cava is normal in size with greater than 50% respiratory variability, suggesting right atrial pressure of 3 mmHg. Comparison(s): No prior Echocardiogram. FINDINGS  Left Ventricle: Left ventricular ejection fraction, by estimation, is 60 to 65%. The left ventricle has normal function. The left ventricle has no regional wall motion abnormalities. The left ventricular internal cavity size was normal in size. There is  no left  ventricular hypertrophy. Left ventricular diastolic parameters are consistent with Grade I diastolic dysfunction (impaired relaxation). Indeterminate filling pressures. Right Ventricle: The right ventricular size is normal. No increase in right ventricular wall thickness. Right ventricular systolic function is normal. There is normal pulmonary artery systolic pressure. The tricuspid regurgitant velocity is 2.27 m/s, and  with an assumed right atrial pressure of 3 mmHg, the estimated right ventricular systolic pressure is 78.6 mmHg. Left Atrium: Left atrial size was normal in size. Right Atrium: Right atrial size was normal in size. Pericardium: There is no evidence of pericardial effusion. Mitral  Valve: The mitral valve is grossly normal. Trivial mitral valve regurgitation. Tricuspid Valve: The tricuspid valve is grossly normal. Tricuspid valve regurgitation is trivial. Aortic Valve: The aortic valve is tricuspid. Aortic valve regurgitation is not visualized. Pulmonic Valve: The pulmonic valve was grossly normal. Pulmonic valve regurgitation is not visualized. Aorta: The aortic root and ascending aorta are structurally normal, with no evidence of dilitation. Venous: The inferior vena cava is normal in size with greater than 50% respiratory variability, suggesting right atrial pressure of 3 mmHg. IAS/Shunts: No atrial level shunt detected by color flow Doppler.  LEFT VENTRICLE PLAX 2D LVIDd:         4.20 cm  Diastology LVIDs:         2.80 cm  LV e' medial:    7.14 cm/s LV PW:         0.90 cm  LV E/e' medial:  10.0 LV IVS:        0.90 cm  LV e' lateral:   11.20 cm/s LVOT diam:     2.00 cm  LV E/e' lateral: 6.4 LV SV:         66 LV SV Index:   40       2D Longitudinal Strain LVOT Area:     3.14 cm 2D Strain GLS (A2C):   -26.7 %                         2D Strain GLS (A3C):   -20.2 %                         2D Strain GLS (A4C):   -30.4 %                         2D Strain GLS Avg:     -25.8 %                          3D Volume EF:                         3D EF:        72 %                         LV EDV:       111 ml                         LV ESV:       31 ml                         LV SV:        79 ml RIGHT VENTRICLE RV Basal diam:  2.70  cm RV S prime:     8.43 cm/s TAPSE (M-mode): 2.4 cm RVSP:           23.6 mmHg LEFT ATRIUM             Index       RIGHT ATRIUM           Index LA diam:        3.90 cm 2.34 cm/m  RA Pressure: 3.00 mmHg LA Vol (A2C):   24.1 ml 14.44 ml/m RA Area:     7.70 cm LA Vol (A4C):   32.3 ml 19.35 ml/m RA Volume:   13.00 ml  7.79 ml/m LA Biplane Vol: 28.2 ml 16.89 ml/m  AORTIC VALVE LVOT Vmax:   99.90 cm/s LVOT Vmean:  62.700 cm/s LVOT VTI:    0.211 m  AORTA Ao Root diam: 2.90 cm Ao  Asc diam:  2.80 cm MITRAL VALVE               TRICUSPID VALVE                            TR Peak grad:   20.6 mmHg                            TR Vmax:        227.00 cm/s MV E velocity: 71.60 cm/s  Estimated RAP:  3.00 mmHg MV A velocity: 68.10 cm/s  RVSP:           23.6 mmHg MV E/A ratio:  1.05                            SHUNTS                            Systemic VTI:  0.21 m                            Systemic Diam: 2.00 cm Lori Bishop MD Electronically signed by Lori Bishop MD Signature Date/Time: 12/04/2020/10:04:23 AM    Final      Additional studies/ records that were reviewed today include:  I reviewed the records from 2020 of Dr. Casimer Lanius    ASSESSMENT:    1. Palpitations   2. Symptomatic PVCs   3. Mixed hyperlipidemia   4. Elevated LFTs   5. Anxiety     PLAN:  Lori Allen is a 60 year old female who established cardiology care with me on October 24, 2020.  She has a history of PVCs which had been symptomatic in the past and apparently had been on carvedilol 3.125 mg twice a day.  When I saw her, she was not on any therapy.  She had begun to notice episodic palpitations.  At the time, her ECG showed sinus bradycardia at 57 bpm without ectopy and with normal intervals.  I gave her a prescription for metoprolol to tartrate to take 25 mg on an as-needed basis but did not start her on daily therapy pending her Holter monitor and echocardiographic assessments.  Echo Doppler study has confirmed normal systolic function with mild grade 1 diastolic relaxation impairment.  She had normal valves.  I thoroughly reviewed her Holter monitor study with her today in the office.  Although this was scheduled  for 14-day monitor she wore it for 7 days and 14 hours and 10 minutes.  Her rhythm predominantly was sinus rhythm throughout the recording with the slowest heart rate of 44 and maximum heart rate of 122.  She had isolated PVCs.  There were several episodes of ventricular bigeminy and  trigeminy and rare ventricular couplet.  Her EKG today shows sinus bradycardia at 52 bpm.  With her normal LV function and bradycardia I will not start her on daily beta-blocker and have recommended she take the metoprolol tartrate on a as needed basis if palpitations are seemingly more apparent.  I thoroughly reviewed her laboratory.  She has significant mixed hyperlipidemia and also had elevation of transaminases.  Upon further questioning previous diet was poor and it is possible her elevated transaminases may be contributed by fatty liver.  We had attempted to initiate Vascepa but this was denied by her insurance.  Presently I have given her a prescription for generic Lovaza 1 capsule twice a day for omega-3 fatty acid benefit and reduction in triglycerides.  Her LDL was significantly elevated on her lipid study.  With her increased LFTs I have suggested at least short-term she drop atorvastatin to 5 mg.  I had added Zetia once the laboratory became available.  I had a long discussion with her regarding a heart healthy diet and particularly discussed the Mediterranean diet.  Prospectively, she admitted to significant saturated and transfatty intake.  In 2 months I have recommended a follow-up comprehensive metabolic panel and lipid studies.  I will see her back in the office in 3 months for follow-up evaluation and further recommendations will be made at that time.  She has started to walk on a daily basis and is now walking 1 to 2 miles per day.  Does the importance of exercise at least 5 days/week for 30 minutes of moderate intensity if at all possible.  I will see her in 3 months for follow-up evaluation or sooner as needed.    Medication Adjustments/Labs and Tests Ordered: Current medicines are reviewed at length with the patient today.  Concerns regarding medicines are outlined above.  Medication changes, Labs and Tests ordered today are listed in the Patient Instructions below. Patient Instructions   Medication Instructions:  DECREASE atorvastatin to 64m (1/2 of 137mtablet) daily.   BEGIN Lovaza 1g (1 capsule) twice daily.   *If you need a refill on your cardiac medications before your next appointment, please call your pharmacy*   Lab Work: Lipid, CMET FASTING in 2 months.   If you have labs (blood work) drawn today and your tests are completely normal, you will receive your results only by: [place] you have MyChart) OR A paper copy in the mail If you have any lab test that is abnormal or we need to change your treatment, we will call you to review the results.   Testing/Procedures: None ordered.   Follow-Up: [facility] and your health needs are our priority.  As part of our continuing mission to provide you with exceptional heart care, we have created designated Provider Care Teams.  These Care Teams include your primary Cardiologist (physician) and Advanced Practice Providers (APPs -  Physician Assistants and Nurse Practitioners) who all work together to provide you with the care you need, when you need it.  We recommend signing up for the patient portal called "MyChart".  Sign up information is provided on this After Visit Summary.  MyChart is used to connect  with patients for Virtual Visits (Telemedicine).  Patients are able to view lab/test results, encounter notes, upcoming appointments, etc.  Non-urgent messages can be sent to your provider as well.   To learn more about what you can do with MyChart, go to [email or web address].    Your next appointment:   3 month(s)  The format for your next appointment:   In Person  Provider:   Shelva Majestic, MD      Signed, Shelva Majestic, MD  12/05/2020 12:05 PM    [facility] [address], [place], [place], [place]  [address] Phone: [phone]

## 2020-12-05 NOTE — Patient Instructions (Signed)
Medication Instructions:  DECREASE atorvastatin to '5mg'$  (1/2 of '10mg'$  tablet) daily.   BEGIN Lovaza 1g (1 capsule) twice daily.   *If you need a refill on your cardiac medications before your next appointment, please call your pharmacy*   Lab Work: Lipid, CMET FASTING in 2 months.   If you have labs (blood work) drawn today and your tests are completely normal, you will receive your results only by: [place] (if you have MyChart) OR A paper copy in the mail If you have any lab test that is abnormal or we need to change your treatment, we will call you to review the results.   Testing/Procedures: None ordered.   Follow-Up: At [facility], you and your health needs are our priority.  As part of our continuing mission to provide you with exceptional heart care, we have created designated Provider Care Teams.  These Care Teams include your primary Cardiologist (physician) and Advanced Practice Providers (APPs -  Physician Assistants and Nurse Practitioners) who all work together to provide you with the care you need, when you need it.  We recommend signing up for the patient portal called "MyChart".  Sign up information is provided on this After Visit Summary.  MyChart is used to connect with patients for Virtual Visits (Telemedicine).  Patients are able to view lab/test results, encounter notes, upcoming appointments, etc.  Non-urgent messages can be sent to your provider as well.   To learn more about what you can do with MyChart, go to [email or web address].    Your next appointment:   3 month(s)  The format for your next appointment:   In Person  Provider:   Shelva Majestic, MD

## 2020-12-06 ENCOUNTER — Telehealth: Payer: Self-pay

## 2020-12-06 NOTE — Telephone Encounter (Signed)
Prior authorization for Vascepa received from [facility] stating pt's plan does not cover this medication.   CMM Key:  Message sent to PA department for processing.

## 2020-12-07 NOTE — Telephone Encounter (Signed)
**Note De-Identified Katricia Prehn Obfuscation** I started a Vascepa PA through covermymed. Key: [id number]

## 2020-12-19 NOTE — Telephone Encounter (Signed)
**Note De-Identified Lori Allen Obfuscation** Lori Allen (Key: Lori Allen) Vascepa 1GM capsules  Form: [employer] ePA Form 2017 [place]CP[place] Determination: Favorable Prior authorization for Vascepa has been approved.  Message from plan: The request has been approved. The authorization is effective from 12/18/2020 to 12/17/2021.  I have notified [place] of this approval.

## 2021-01-01 LAB — HEPATIC FUNCTION PANEL
ALT: 71 IU/L — ABNORMAL HIGH (ref 0–32)
AST: 67 IU/L — ABNORMAL HIGH (ref 0–40)
Albumin: 4.8 g/dL (ref 3.8–4.9)
Alkaline Phosphatase: 79 IU/L (ref 44–121)
Bilirubin Total: 0.7 mg/dL (ref 0.0–1.2)
Bilirubin, Direct: 0.2 mg/dL (ref 0.00–0.40)
Total Protein: 6.8 g/dL (ref 6.0–8.5)

## 2021-01-18 ENCOUNTER — Other Ambulatory Visit: Payer: Self-pay

## 2021-01-18 DIAGNOSIS — R7989 Other specified abnormal findings of blood chemistry: Secondary | ICD-10-CM

## 2021-01-25 ENCOUNTER — Telehealth: Payer: Self-pay | Admitting: Cardiovascular Disease

## 2021-01-25 NOTE — Telephone Encounter (Signed)
Pt c/o medication issue:  1. Name of Medication:  icosapent Ethyl (VASCEPA) 1 g capsule  omega-3 acid ethyl esters (LOVAZA) 1 g capsule   2. How are you currently taking this medication (dosage and times per day)? Not taking Vascepa. Taking Omega 3 twice a day   3. Are you having a reaction (difficulty breathing--STAT)? No   4. What is your medication issue? Insurance has finally approved Vascepa she is wanting to know if she should start taking it with the Omega 3 she is currently on.

## 2021-01-25 NOTE — Telephone Encounter (Signed)
Pt notified to stop Omega 3 then start Vascepa. Verbalizes understanding.

## 2021-02-13 ENCOUNTER — Telehealth: Payer: Self-pay | Admitting: Cardiovascular Disease

## 2021-02-13 MED ORDER — ATORVASTATIN CALCIUM 10 MG PO TABS: 5.0000 mg | ORAL_TABLET | Freq: Every day | ORAL | 2 refills | Status: DC

## 2021-02-13 NOTE — Telephone Encounter (Signed)
*  STAT* If patient is at the pharmacy, call can be transferred to refill team.   1. Which medications need to be refilled? (please list name of each medication and dose if known) new prescription for Atorvastatin 5 mg  2. Which pharmacy/location (including street and city if local pharmacy) is medication to be sent to? [facility] [address], [place]  3. Do they need a 30 day or 90 day supply? 90 days and refills

## 2021-02-13 NOTE — Telephone Encounter (Signed)
Refills has been sent to the pharmacy. 

## 2021-02-14 ENCOUNTER — Other Ambulatory Visit: Payer: Self-pay

## 2021-02-14 DIAGNOSIS — R7989 Other specified abnormal findings of blood chemistry: Secondary | ICD-10-CM

## 2021-02-14 LAB — HEPATIC FUNCTION PANEL
ALT: 64 IU/L — ABNORMAL HIGH (ref 0–32)
AST: 51 IU/L — ABNORMAL HIGH (ref 0–40)
Albumin: 5.1 g/dL — ABNORMAL HIGH (ref 3.8–4.9)
Alkaline Phosphatase: 87 IU/L (ref 44–121)
Bilirubin Total: 0.7 mg/dL (ref 0.0–1.2)
Bilirubin, Direct: 0.21 mg/dL (ref 0.00–0.40)
Total Protein: 6.9 g/dL (ref 6.0–8.5)

## 2021-03-18 ENCOUNTER — Telehealth: Payer: Self-pay

## 2021-03-18 NOTE — Telephone Encounter (Signed)
Pt came in for lab work today. Phlebotomist called into triage from lab requesting assistance with a pt that just had blood drawn that is feeling diaphoretic, hot, dizzy and faint. Spoke with pt, who states she is feeling better now. Pt is fasting for labs. Pt's blood sugar checked with results 111. Pt's blood pressure 95/56 with HR 54.  Pt states that this blood pressure is a little low for her. Pt provided with peanut butter crackers and water. Pt states that she is feeling a lot better. Pt will finish snack before she leaves.   Checked on pt. States she feels a lot better. Pt does plan to drive home.

## 2021-03-19 LAB — COMPREHENSIVE METABOLIC PANEL
ALT: 57 IU/L — ABNORMAL HIGH (ref 0–32)
AST: 45 IU/L — ABNORMAL HIGH (ref 0–40)
Albumin/Globulin Ratio: 2.5 — ABNORMAL HIGH (ref 1.2–2.2)
Albumin: 4.8 g/dL (ref 3.8–4.9)
Alkaline Phosphatase: 89 IU/L (ref 44–121)
BUN/Creatinine Ratio: 16 (ref 12–28)
BUN: 10 mg/dL (ref 8–27)
Bilirubin Total: 1 mg/dL (ref 0.0–1.2)
CO2: 22 mmol/L (ref 20–29)
Calcium: 9.1 mg/dL (ref 8.7–10.3)
Chloride: 105 mmol/L (ref 96–106)
Creatinine, Ser: 0.63 mg/dL (ref 0.57–1.00)
Globulin, Total: 1.9 g/dL (ref 1.5–4.5)
Glucose: 96 mg/dL (ref 70–99)
Potassium: 4.2 mmol/L (ref 3.5–5.2)
Sodium: 144 mmol/L (ref 134–144)
Total Protein: 6.7 g/dL (ref 6.0–8.5)
eGFR: 101 mL/min/{1.73_m2} (ref >59)

## 2021-03-19 LAB — LIPID PANEL
Chol/HDL Ratio: 3.3 ratio (ref 0.0–4.4)
Cholesterol, Total: 149 mg/dL (ref 100–199)
HDL: 45 mg/dL (ref >39)
LDL Chol Calc (NIH): 86 mg/dL (ref 0–99)
Triglycerides: 97 mg/dL (ref 0–149)
VLDL Cholesterol Cal: 18 mg/dL (ref 5–40)

## 2021-03-20 ENCOUNTER — Encounter: Payer: Self-pay | Admitting: Cardiovascular Disease

## 2021-03-20 ENCOUNTER — Other Ambulatory Visit: Payer: Self-pay

## 2021-03-20 ENCOUNTER — Ambulatory Visit (INDEPENDENT_AMBULATORY_CARE_PROVIDER_SITE_OTHER): Payer: 59 | Admitting: Cardiovascular Disease

## 2021-03-20 VITALS — BP 128/66 | HR 53 | Ht 62.0 in | Wt 144.0 lb

## 2021-03-20 DIAGNOSIS — E785 Hyperlipidemia, unspecified: Secondary | ICD-10-CM | POA: Diagnosis not present

## 2021-03-20 DIAGNOSIS — I493 Ventricular premature depolarization: Secondary | ICD-10-CM | POA: Diagnosis not present

## 2021-03-20 DIAGNOSIS — E782 Mixed hyperlipidemia: Secondary | ICD-10-CM | POA: Diagnosis not present

## 2021-03-20 DIAGNOSIS — R002 Palpitations: Secondary | ICD-10-CM

## 2021-03-20 DIAGNOSIS — R7989 Other specified abnormal findings of blood chemistry: Secondary | ICD-10-CM

## 2021-03-20 NOTE — Patient Instructions (Signed)
Medication Instructions:  Continue current medications. No changes.  *If you need a refill on your cardiac medications before your next appointment, please call your pharmacy*   Lab Work: Return for fasting blood work (CBC, CMET, TSH If you have labs (blood work) drawn today and your tests are completely normal, you will receive your results only by: [place] (if you have MyChart) OR A paper copy in the mail If you have any lab test that is abnormal or we need to change your treatment, we will call you to review the results.   Follow-Up: At [facility], you and your health needs are our priority.  As part of our continuing mission to provide you with exceptional heart care, we have created designated Provider Care Teams.  These Care Teams include your primary Cardiologist (physician) and Advanced Practice Providers (APPs -  Physician Assistants and Nurse Practitioners) who all work together to provide you with the care you need, when you need it.  We recommend signing up for the patient portal called "MyChart".  Sign up information is provided on this After Visit Summary.  MyChart is used to connect with patients for Virtual Visits (Telemedicine).  Patients are able to view lab/test results, encounter notes, upcoming appointments, etc.  Non-urgent messages can be sent to your provider as well.   To learn more about what you can do with MyChart, go to [email or web address].    Your next appointment:   6 month(s)  The format for your next appointment:   In Person  Provider:   Dr. Corky Downs, MD

## 2021-03-20 NOTE — Progress Notes (Signed)
Cardiology Office Note    Date:  03/20/2021   ID:  Lori Allen, DOB March 09, 1961, MRN [id number]  PCP:  Nicoletta Dress, MD  Cardiologist:  Shelva Majestic, MD   4 month F/U evaluation    History of Present Illness:  Lori Allen is a 60 y.o. female who presents for a 4 month follow-up evaluation.  Lori Allen has a history of occasional palpitations and remotely had been cared by Dr. Geraldo Pitter for palpitations and had been treated with carvedilol.  In 2018, she began seeing Dr. Casimer Lanius for cardiology care and was taking carvedilol 3.125 mg twice a day.  She also was on atorvastatin 10 mg and was taking alprazolam as needed for anxiety.  She last saw Dr. Otho Perl in November 2020 for preoperative clearance prior to undergoing gynecologic surgery for cyst on her ovary and uterine fibroids.  I saw her for my initial evaluation on October 24, 2020.  Prior to that evaluation she had run out of carvedilol and she again started to notice occasional episodes of chest fluttering that when she would lie down she would feel "swimmy headed."   She denied any exertional precipitated chest pain.  She is aware of the PVCs when she is still.  Her former husband, and current boyfriend has seen me for cardiology care and she now presents to me for cardiology evaluation.  She has a history of hyperlipidemia, anxiety, as well as previous uterine fibroids and ovarian cyst.  She is status post hysterectomy and lap cholecystectomy.  She underwent right breast surgery by Dr. Jene Every for breast discordance in April 2021.  During her initial evaluation, she was bradycardic and did not have ectopy on ECG.  She had been off the carvedilol for some time.  I recommended she undergo a 2D echo Doppler study and scheduled her for a 2-week monitor.  I also gave her a prescription for metoprolol to tartrate 25 mg to take on a as needed basis.  She is taking this medication on 1 occasion.  Her long-term monitor from June 20  until only June 28 showed predominant sinus rhythm.  She developed a rash and therefore did not wear the monitor for the entire 2 weeks duration.  There were no episodes of atrial fibrillation or atrial flutter.  Slowest heart rate was sinus bradycardia at 44 and fastest heart rate was sinus tachycardia at 122 bpm.  There was a very rare isolated PAC.  There also were occasional PVCs with total ventricular ectopy at 2.38%.  Most were isolated.  She had several episodes of ventricular bigeminy and trigeminy and total of 4 couplets representing less than 0.01%.  I last saw her on December 05, 2020.  An echo Doppler study on December 04, 2020 showed an EF of 60 to 65% with grade 1 diastolic dysfunction.  Valves were normal.  Laboratory from her vision evaluation showed a normal magnesium and thyroid stimulating hormone.  Chemistry was normal with the exception of LFT elevation with AST at 60 and ALT at 86.  Lipid studies were abnormal with total cholesterol 283, triglycerides 363, VLDL cholesterol 70, and LDL cholesterol at 171.  At that time, I recommended initiation of Zetia and Vascepa 2 capsules twice a day.  She apparently had been previously prescribed atorvastatin 10 mg.  Since her laboratory she has been much more aware of try to shop less for saturated and transfatty products.  She feels well.  Her palpitations have improved but at  times there is a occasional skip which seems more when she is lying flat.    Since I saw her, she has made dramatic change in her diet.  She has lost approximately 13 pounds in 2 months.  Repeat laboratory on March 18, 2021 showed marked improvement with total cholesterol improving from 283 down to 149, triglycerides 363 down to 97, VLDL 70 down to 18, and LDL cholesterol 171 down to 86.  HDL cholesterol was 45.  There was minimal AST elevation with AST at 45 and ALT at 57, improved from 67 and 71, respectively.  Clinically, she feels well.  She denies chest pain PND orthopnea.  She  is unaware of recurrent palpitations.  She has a prescription for metoprolol tartrate but has only taken it 2 times over the past 3 months 25 mg.  She is on Vascepa in addition to Zetia for lipid management as well as very low-dose atorvastatin 5 mg.  She presents for evaluation.  Past Medical History:  Diagnosis Date   Palpitations    infrequent , occurs with stress situation    PONV (postoperative nausea and vomiting)    PVC (premature ventricular contraction)    onset 2011      Past Surgical History:  Procedure Laterality Date   BREAST LUMPECTOMY WITH RADIOACTIVE SEED LOCALIZATION Right 08/26/2019   Procedure: RIGHT BREAST LUMPECTOMY WITH RADIOACTIVE SEED LOCALIZATION;  Surgeon: Jovita Kussmaul, MD;  Location: [place];  Service: General;  Laterality: Right;   CHOLECYSTECTOMY  2012   [facility] , done laparoscopically   [place] N/A 04/26/2019   Procedure: LAPAROSCOPIC ASSISTED VAGINAL HYSTERECTOMY WITH SALPINGECTOMY RIGHT OOPHORECTOMY RIGHT OVARIAN CYSTECTOMY;  Surgeon: Jerelyn Charles, MD;  Location: [place];  Service: Gynecology;  Laterality: N/A;   LEEP  1990    Current Medications: Outpatient Medications Prior to Visit  Medication Sig Dispense Refill   ALPRAZolam (XANAX) 0.25 MG tablet Take 1 tablet (0.25 mg total) by mouth at bedtime as needed. 30 tablet 0   atorvastatin (LIPITOR) 10 MG tablet Take 0.5 tablets (5 mg total) by mouth daily. 45 tablet 2   dicyclomine (BENTYL) 10 MG capsule Take 10 mg by mouth every 6 (six) hours as needed.     estradiol (VIVELLE-DOT) 0.075 MG/24HR 1 patch 2 (two) times a week.     fluticasone (FLONASE) 50 MCG/ACT nasal spray Place 1 spray into both nostrils daily.     icosapent Ethyl (VASCEPA) 1 g capsule Take 2 capsules (2 g total) by mouth 2 (two) times daily. 120 capsule 11   levocetirizine (XYZAL) 5 MG tablet Take 5 mg by mouth daily as needed for  allergies.      Multiple Vitamins-Minerals (MULTIVITAMIN WITH MINERALS) tablet Take 1 tablet by mouth daily.     Wheat Dextrin (BENEFIBER PO) Take by mouth.     ezetimibe (ZETIA) 10 MG tablet Take 1 tablet (10 mg total) by mouth daily. 90 tablet 3   metoprolol tartrate (LOPRESSOR) 25 MG tablet Take 1 tablet (25 mg total) by mouth as needed (For palpitations.). 30 tablet 3   BIOTIN FORTE PO Take by mouth.     Glucosamine 750 MG TABS Take by mouth.     ibuprofen (ADVIL) 800 MG tablet Take 1 tablet (800 mg total) by mouth every 8 (eight) hours. (Patient not taking: Reported on 12/05/2020) 30 tablet 0   omega-3 acid ethyl esters (LOVAZA) 1 g capsule Take 1 capsule (1 g total)  by mouth 2 (two) times daily. 180 capsule 3   No facility-administered medications prior to visit.     Allergies:   Patient has no known allergies.   Social History   Socioeconomic History   Marital status: Divorced    Spouse name: Not on file   Number of children: Not on file   Years of education: Not on file   Highest education level: Not on file  Occupational History   Not on file  Tobacco Use   Smoking status: Never   Smokeless tobacco: Never  Substance and Sexual Activity   Alcohol use: Yes    Comment: seldom    Drug use: Not on file   Sexual activity: Not on file  Other Topics Concern   Not on file  Social History Narrative   Not on file   Social Determinants of Health   Financial Resource Strain: Not on file  Food Insecurity: Not on file  Transportation Needs: Not on file  Physical Activity: Not on file  Stress: Not on file  Social Connections: Not on file    Socially, she was born in [place].  She completed 2 years of community college.  Initially she was married to [employer] her first husband at a young age.  She subsequently was married for 30 years to her second husband with subsequent divorce.  Recently she has been seeing her first husband again.  She is currently unemployed but  previously had worked as a Water quality scientist. Not routinely exercising over the past 2 years.  She has 3 grandchildren.  Family History: Her father is 32 and has a history of atrial fibrillation.  Her mother is 72 and has elevated lipids.  She has 1 brother age 24 who smokes.  ROS General: Negative; No fevers, chills, or night sweats;  HEENT: Negative; No changes in vision or hearing, sinus congestion, difficulty swallowing Pulmonary: Negative; No cough, wheezing, shortness of breath, hemoptysis Cardiovascular: See HPI GI: Negative; No nausea, vomiting, diarrhea, or abdominal pain GU: Negative; No dysuria, hematuria, or difficulty voiding Musculoskeletal: Negative; no myalgias, joint pain, or weakness Hematologic/Oncology: Negative; no easy bruising, bleeding Endocrine: Negative; no heat/cold intolerance; no diabetes Neuro: Negative; no changes in balance, headaches Skin: Negative; No rashes or skin lesions Psychiatric: Negative; No behavioral problems, depression Sleep: Sleeping well negative; No snoring, daytime sleepiness, hypersomnolence, bruxism, restless legs, hypnogognic hallucinations, no cataplexy Epworth Sleepiness score 1 arguing against daytime sleepiness Other comprehensive 14 point system review is negative.   PHYSICAL EXAM:   VS:  BP 128/66   Pulse (!) 53   Ht 5' 2"  (1.575 m)   Wt 144 lb (65.3 kg)   LMP  (LMP Unknown) Comment: greater than 1 year since lmp   SpO2 98%   BMI 26.34 kg/m     Repeat blood pressure by me was 122/70  Wt Readings from Last 3 Encounters:  03/20/21 144 lb (65.3 kg)  12/05/20 144 lb 3.2 oz (65.4 kg)  10/24/20 145 lb 6.4 oz (66 kg)       General: Alert, oriented, no distress.  Skin: normal turgor, no rashes, warm and dry HEENT: Normocephalic, atraumatic. Pupils equal round and reactive to light; sclera anicteric; extraocular muscles intact;  Nose without nasal septal hypertrophy Mouth/Parynx benign; Mallinpatti scale 3 Neck: No JVD,  no carotid bruits; normal carotid upstroke Lungs: clear to ausculatation and percussion; no wheezing or rales Chest wall: without tenderness to palpitation Heart: PMI not displaced, RRR, s1 s2 normal, 1/6 systolic  murmur, no diastolic murmur, no rubs, gallops, thrills, or heaves Abdomen: soft, nontender; no hepatosplenomehaly, BS+; abdominal aorta nontender and not dilated by palpation. Back: no CVA tenderness Pulses 2+ Musculoskeletal: full range of motion, normal strength, no joint deformities Extremities: no clubbing cyanosis or edema, Homan's sign negative  Neurologic: grossly nonfocal; Cranial nerves grossly wnl Psychologic: Normal mood and affect   Studies/Labs Reviewed:   March 20, 2021  ECG (independently read by me): Sinus bradycardia at 53, shoirt PR at 108 msec  December 05, 2020 ECG (independently read by me): Sinus bradycardia at 52 bpm.  No ectopy.  Normal intervals.    October 24, 2020 ECG (independently read by me):  Sinus bardycardia at 57; no ectopy; QTc 408 msec  Recent Labs: BMP Latest Ref Rng & Units 03/18/2021 10/25/2020 04/22/2019  Glucose 70 - 99 mg/dL 96 93 111(H)  BUN 8 - 27 mg/dL 10 9 10   Creatinine 0.57 - 1.00 mg/dL 0.63 0.67 0.61  BUN/Creat Ratio 12 - 28 16 13  -  Sodium 134 - 144 mmol/L 144 140 143  Potassium 3.5 - 5.2 mmol/L 4.2 4.0 3.9  Chloride 96 - 106 mmol/L 105 102 108  CO2 20 - 29 mmol/L 22 21 25   Calcium 8.7 - 10.3 mg/dL 9.1 9.3 9.7     Hepatic Function Latest Ref Rng & Units 03/18/2021 02/14/2021 12/31/2020  Total Protein 6.0 - 8.5 g/dL 6.7 6.9 6.8  Albumin 3.8 - 4.9 g/dL 4.8 5.1(H) 4.8  AST 0 - 40 IU/L 45(H) 51(H) 67(H)  ALT 0 - 32 IU/L 57(H) 64(H) 71(H)  Alk Phosphatase 44 - 121 IU/L 89 87 79  Total Bilirubin 0.0 - 1.2 mg/dL 1.0 0.7 0.7  Bilirubin, Direct 0.00 - 0.40 mg/dL - 0.21 0.20    CBC Latest Ref Rng & Units 10/25/2020 04/27/2019 04/22/2019  WBC 3.4 - 10.8 x10E3/uL 7.7 11.4(H) 6.9  Hemoglobin 11.1 - 15.9 g/dL 14.3 12.0 14.4   Hematocrit 34.0 - 46.6 % 42.2 37.3 43.5  Platelets 150 - 450 x10E3/uL 238 196 226   Lab Results  Component Value Date   MCV 92 10/25/2020   MCV 96.9 04/27/2019   MCV 94.6 04/22/2019   Lab Results  Component Value Date   TSH 4.310 10/25/2020   No results found for: HGBA1C   BNP No results found for: BNP  ProBNP No results found for: PROBNP   Lipid Panel     Component Value Date/Time   CHOL 149 03/18/2021 1201   TRIG 97 03/18/2021 1201   HDL 45 03/18/2021 1201   CHOLHDL 3.3 03/18/2021 1201   LDLCALC 86 03/18/2021 1201   LABVLDL 18 03/18/2021 1201     RADIOLOGY: No results found.   Additional studies/ records that were reviewed today include:  I reviewed the records from 2020 of Dr. Casimer Lanius  ECHO: 12/04/2020 IMPRESSIONS   1. Left ventricular ejection fraction, by estimation, is 60 to 65%. The  left ventricle has normal function. The left ventricle has no regional  wall motion abnormalities. Left ventricular diastolic parameters are  consistent with Grade I diastolic  dysfunction (impaired relaxation).   2. Right ventricular systolic function is normal. The right ventricular  size is normal. There is normal pulmonary artery systolic pressure. The  estimated right ventricular systolic pressure is 80.0 mmHg.   3. The mitral valve is grossly normal. Trivial mitral valve  regurgitation.   4. The aortic valve is tricuspid. Aortic valve regurgitation is not  visualized.   5. The  inferior vena cava is normal in size with greater than 50%  respiratory variability, suggesting right atrial pressure of 3 mmHg.    ASSESSMENT:    No diagnosis found.   PLAN:  Ms. Kian Gamarra is a 26 -year-old female who established cardiology care with me on October 24, 2020.  She has a history of PVCs which had been symptomatic in the past and apparently had been on carvedilol 3.125 mg twice a day.  When I saw her, she was not on any therapy.  She had begun to notice episodic  palpitations.  At the time, her ECG showed sinus bradycardia at 57 bpm without ectopy and with normal intervals.  I gave her a prescription for metoprolol to tartrate to take 25 mg on an as-needed basis but did not start her on daily therapy pending her Holter monitor and echocardiographic assessments.  Echo Doppler study has confirmed normal systolic function with mild grade 1 diastolic relaxation impairment.  She had normal valves.  Although her Holter monitor was scheduled for 14-day monitor she wore it for 7 days and 14 hours and 10 minutes.  Her rhythm predominantly was sinus rhythm throughout the recording with the slowest heart rate of 44 and maximum heart rate of 122.  She had isolated PVCs.  There were several episodes of ventricular bigeminy and trigeminy and rare ventricular couplet.  Since I initially saw her, she has had major transition in her diet and marked improvement in her lipid studies.  She is now on Vascepa 2 capsules twice a day in addition to Zetia 10 mg and very low-dose atorvastatin 5 mg.  Her cholesterol has improved from 283 to 149, triglycerides from 363 down to 97, LDL 171 down to 86, with VLDL now at 18 compared to 70.  HDL is 45.  She has lost approximately 13 pounds over the past 2 months and has had major dietary change.  I commended her on her excellent work.  BMI today is 26.3.  Her blood pressure is stable.  She has not had any recurrent palpitations and has only taken metoprolol tartrate 2 times over the past several months.  Her most recent LFTs are improved but remain minimally elevated with an AST at 45 and ALT at 57.  I recommended she continue her dietary adjustment and in 6 months I will check a comprehensive metabolic panel lipid studies, CBC and TSH and see her for follow-up evaluation.   Medication Adjustments/Labs and Tests Ordered: Current medicines are reviewed at length with the patient today.  Concerns regarding medicines are outlined above.  Medication  changes, Labs and Tests ordered today are listed in the Patient Instructions below. There are no Patient Instructions on file for this visit.   Signed, Shelva Majestic, MD  03/20/2021 1:03 PM    [facility] [address] [place], [place], [place]  [address] Phone: [phone]

## 2021-04-02 ENCOUNTER — Other Ambulatory Visit: Payer: Self-pay | Admitting: Obstetrics

## 2021-04-02 DIAGNOSIS — R928 Other abnormal and inconclusive findings on diagnostic imaging of breast: Secondary | ICD-10-CM

## 2021-04-07 ENCOUNTER — Encounter: Payer: Self-pay | Admitting: Cardiovascular Disease

## 2021-04-13 IMAGING — MG MM PLC BREAST LOC DEV 1ST LESION INC*R*
7 series · 7 of 7 positions shown · non-contrast
Comparison: Previous exam(s).

CLINICAL DATA: Status post discordant benign breast biopsy
scheduled for surgical excision requiring preoperative radioactive
seed localization.

EXAM:
MAMMOGRAPHIC GUIDED RADIOACTIVE SEED LOCALIZATION OF THE RIGHT
BREAST

[R CC (1 of 4)]
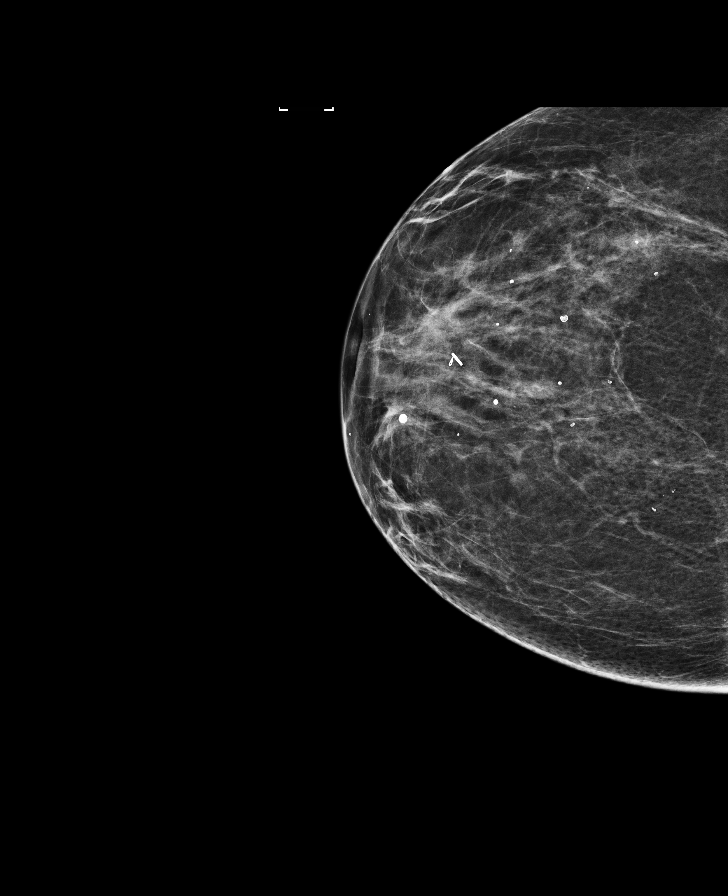

[R LM (1 of 2)]
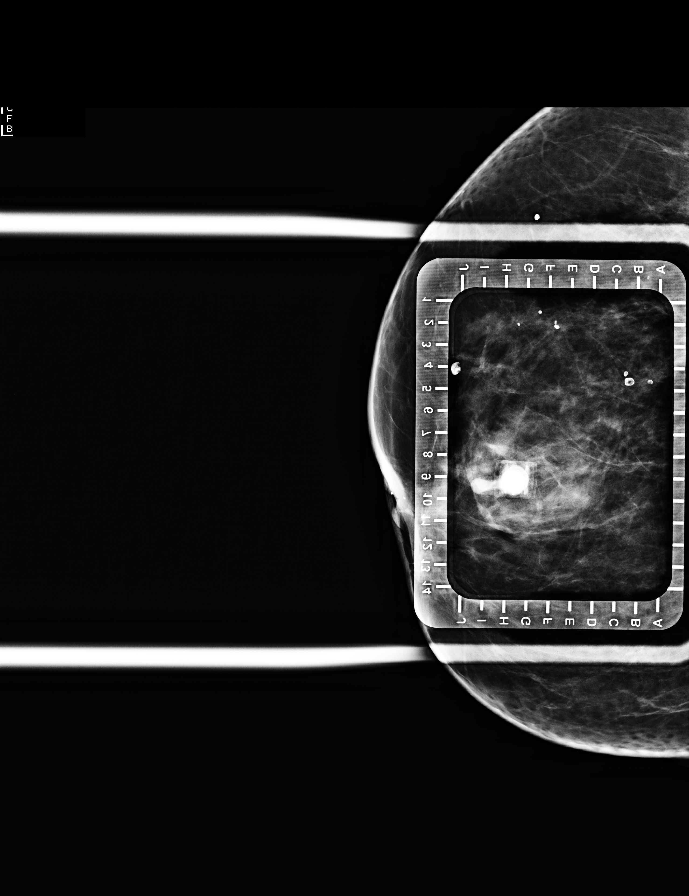

[R LM (2 of 2)]
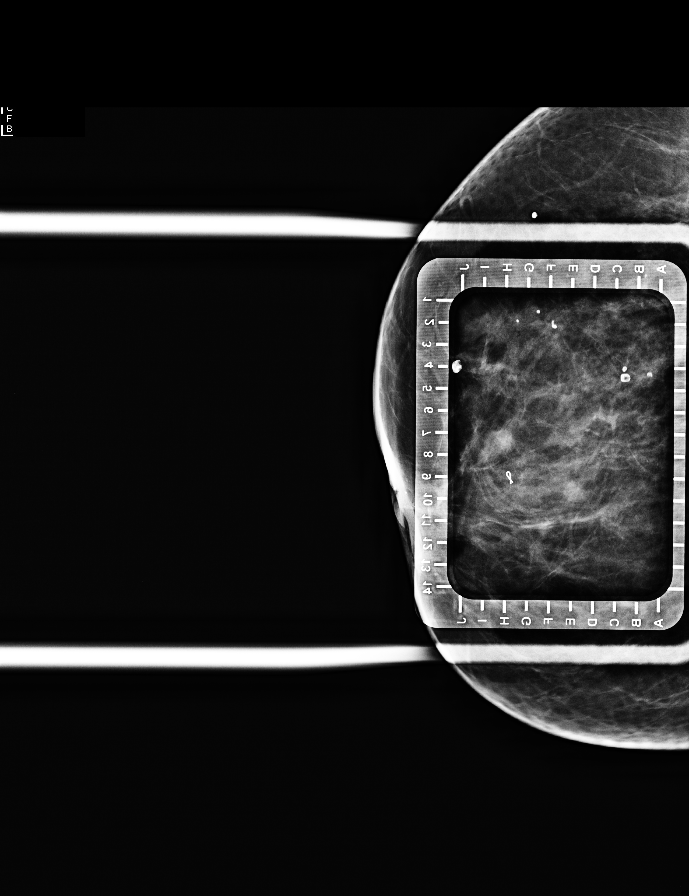

[R CC (2 of 4)]
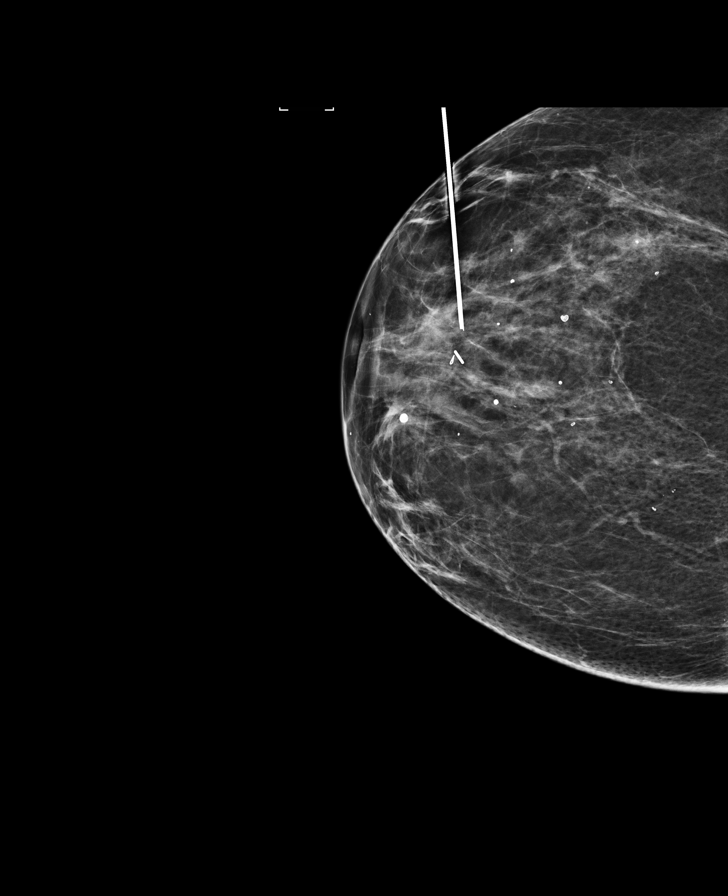

[R CC (3 of 4)]
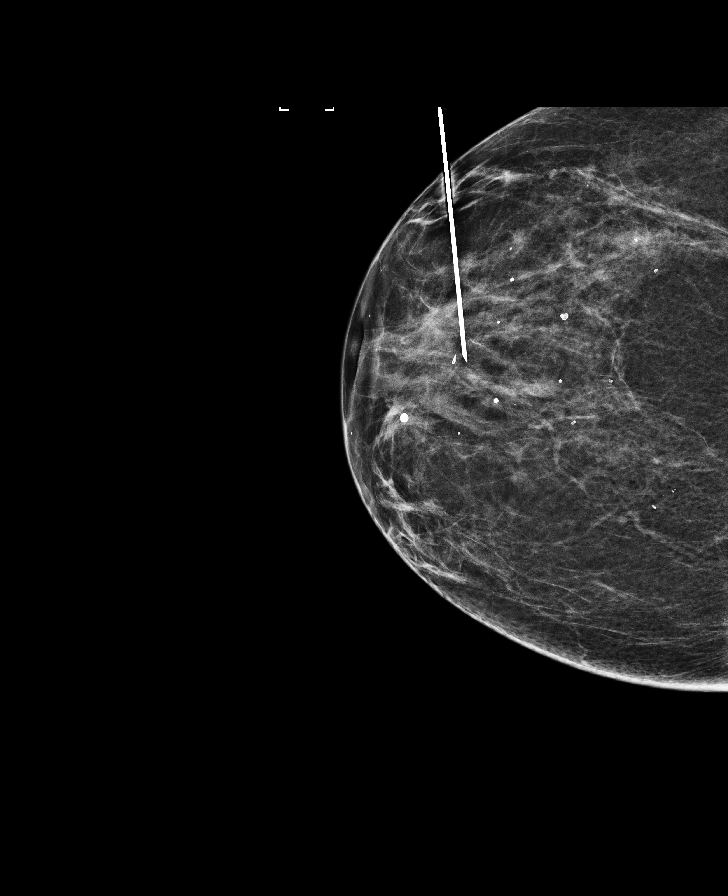

[R CC (4 of 4)]
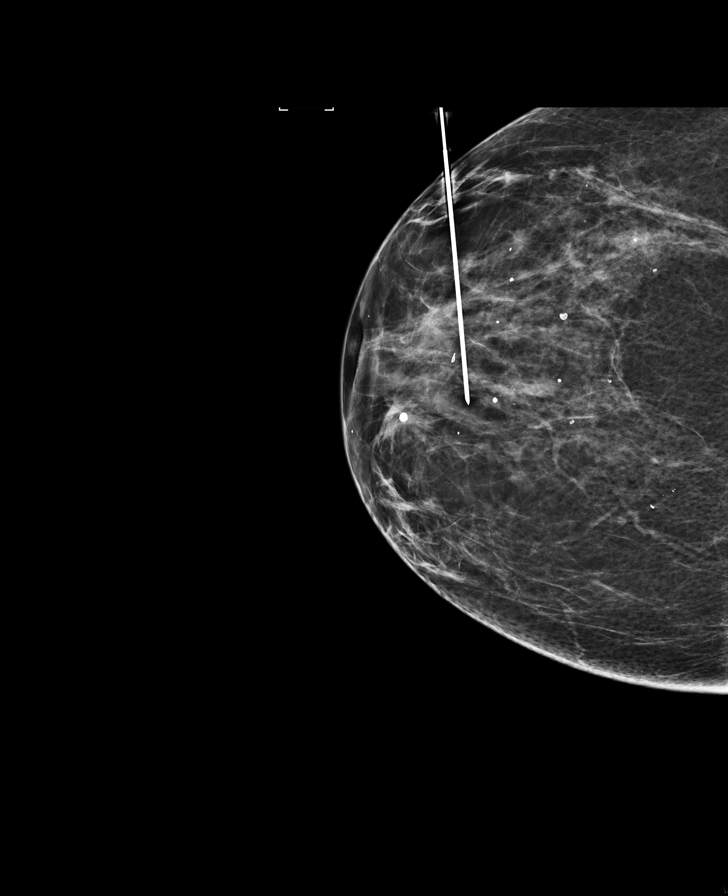

[R ML]
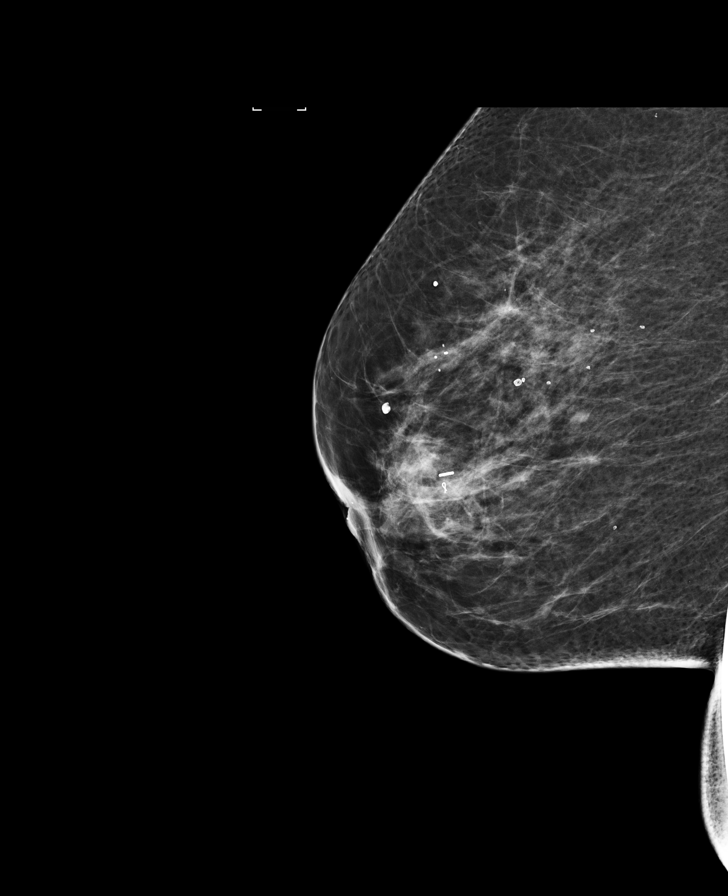

[7 of 7 positions shown; findings below may reference images not displayed]

FINDINGS: Patient presents for radioactive seed localization prior to surgical
excision. I met with the patient and we discussed the procedure of
seed localization including benefits and alternatives. We discussed
the high likelihood of a successful procedure. We discussed the
risks of the procedure including infection, bleeding, tissue injury
and further surgery. We discussed the low dose of radioactivity
involved in the procedure. Informed, written consent was given.

The usual time-out protocol was performed immediately prior to the
procedure.

Using mammographic guidance, sterile technique, 1% lidocaine and an
5-TW6 radioactive seed, the ribbon shaped clip within the RIGHT
breast was localized using a lateral approach. The follow-up
mammogram images confirm the seed in the expected location and were
marked for Dr. Mollinedo.

Follow-up survey of the patient confirms presence of the radioactive
seed.

Order number of 5-TW6 seed:  [id number].

Total activity:  0.246 millicuries reference Date: 08/03/2019

The patient tolerated the procedure well and was released from the
[REDACTED]. She was given instructions regarding seed removal.
IMPRESSION: Radioactive seed localization RIGHT breast. No apparent
complications.

## 2021-04-14 IMAGING — DX MM BREAST SURGICAL SPECIMEN
1 series · 2 of 2 positions shown · non-contrast
Comparison: Previous exam(s).

CLINICAL DATA: Radioactive seed localization of the right breast
was performed August 25, 2019 prior to excisional biopsy.

EXAM:
SPECIMEN RADIOGRAPH OF THE RIGHT BREAST

[Series 2: specimen digital x-ray, derived · right · 0.10mm/px · 2 of 2 slices shown]
[im 1/2]
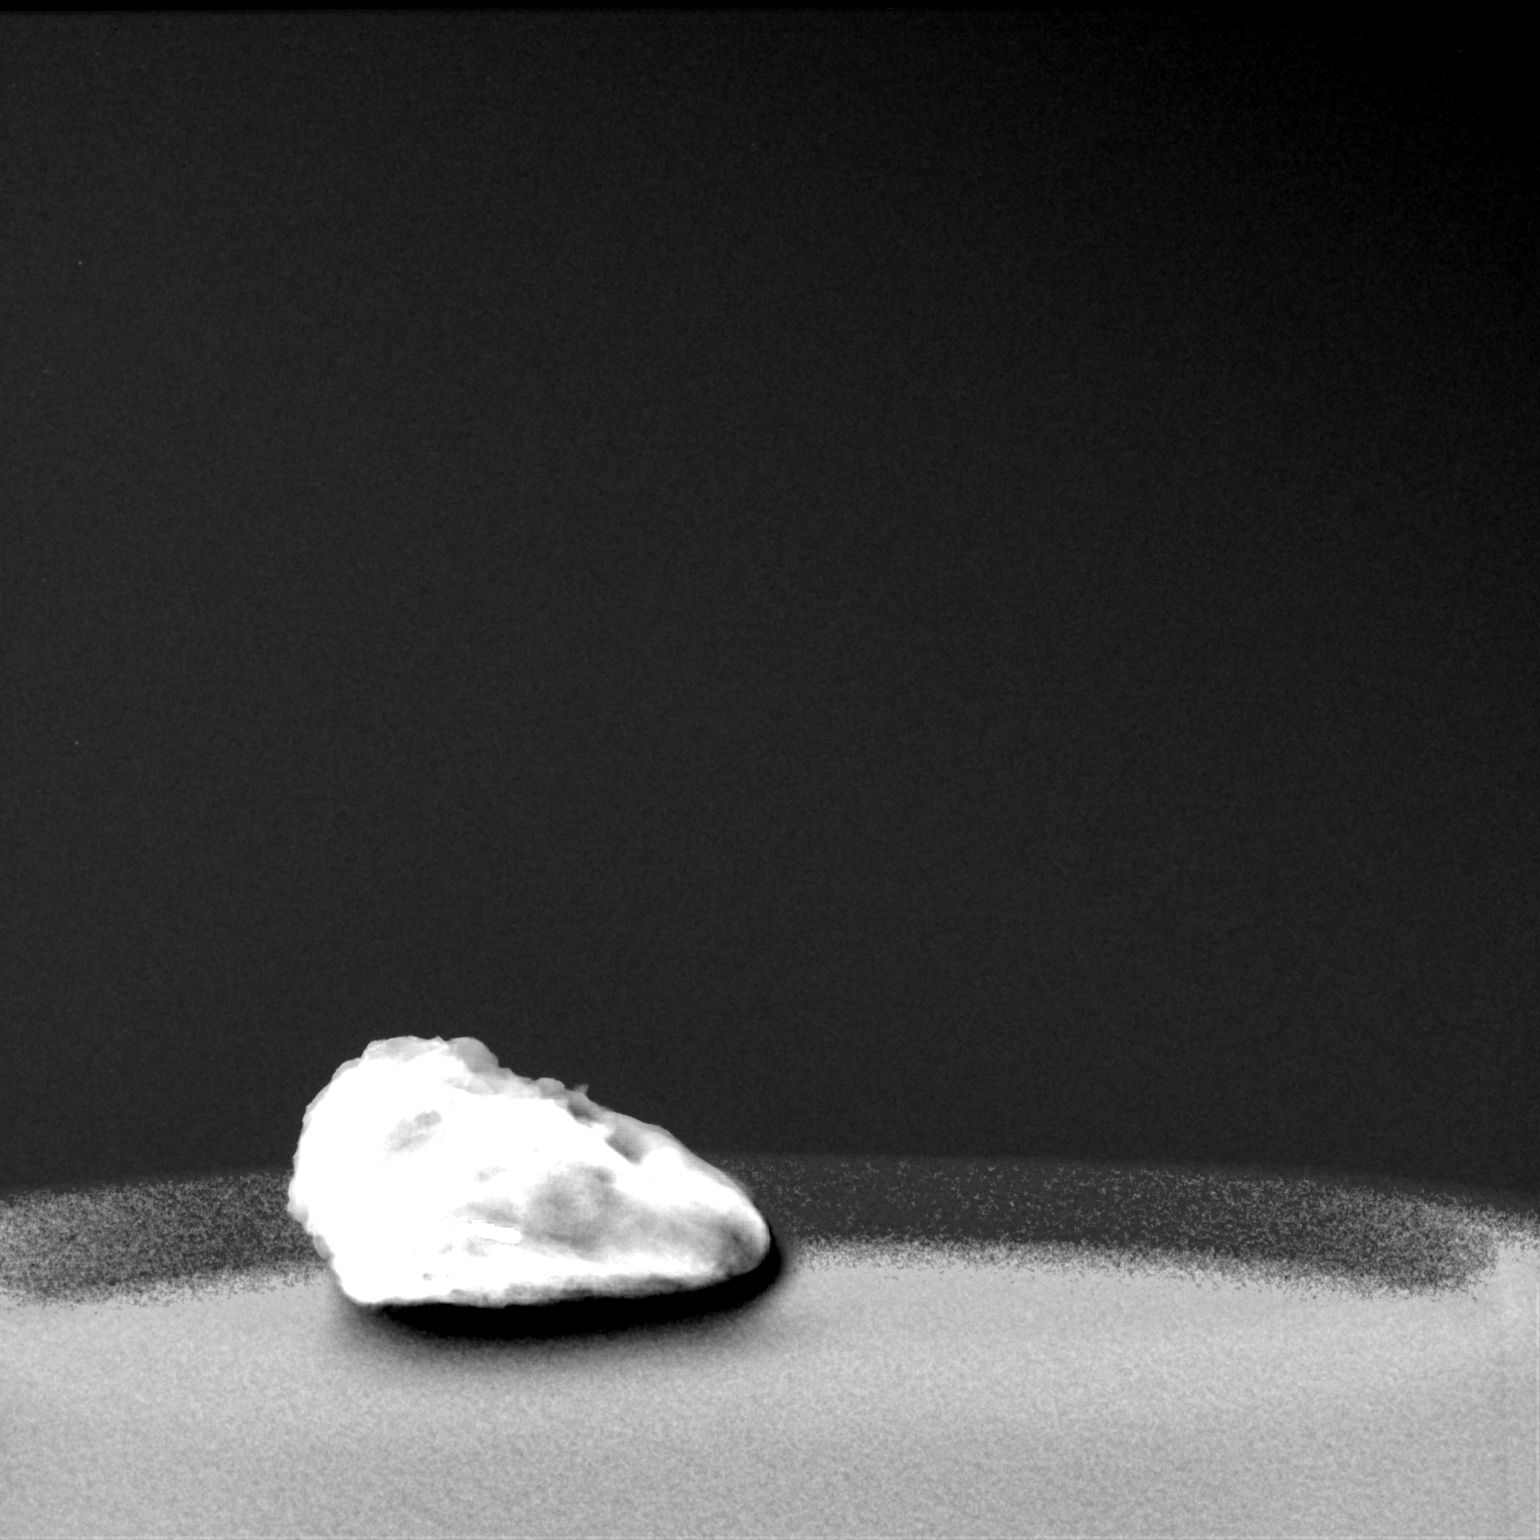
[im 2/2]
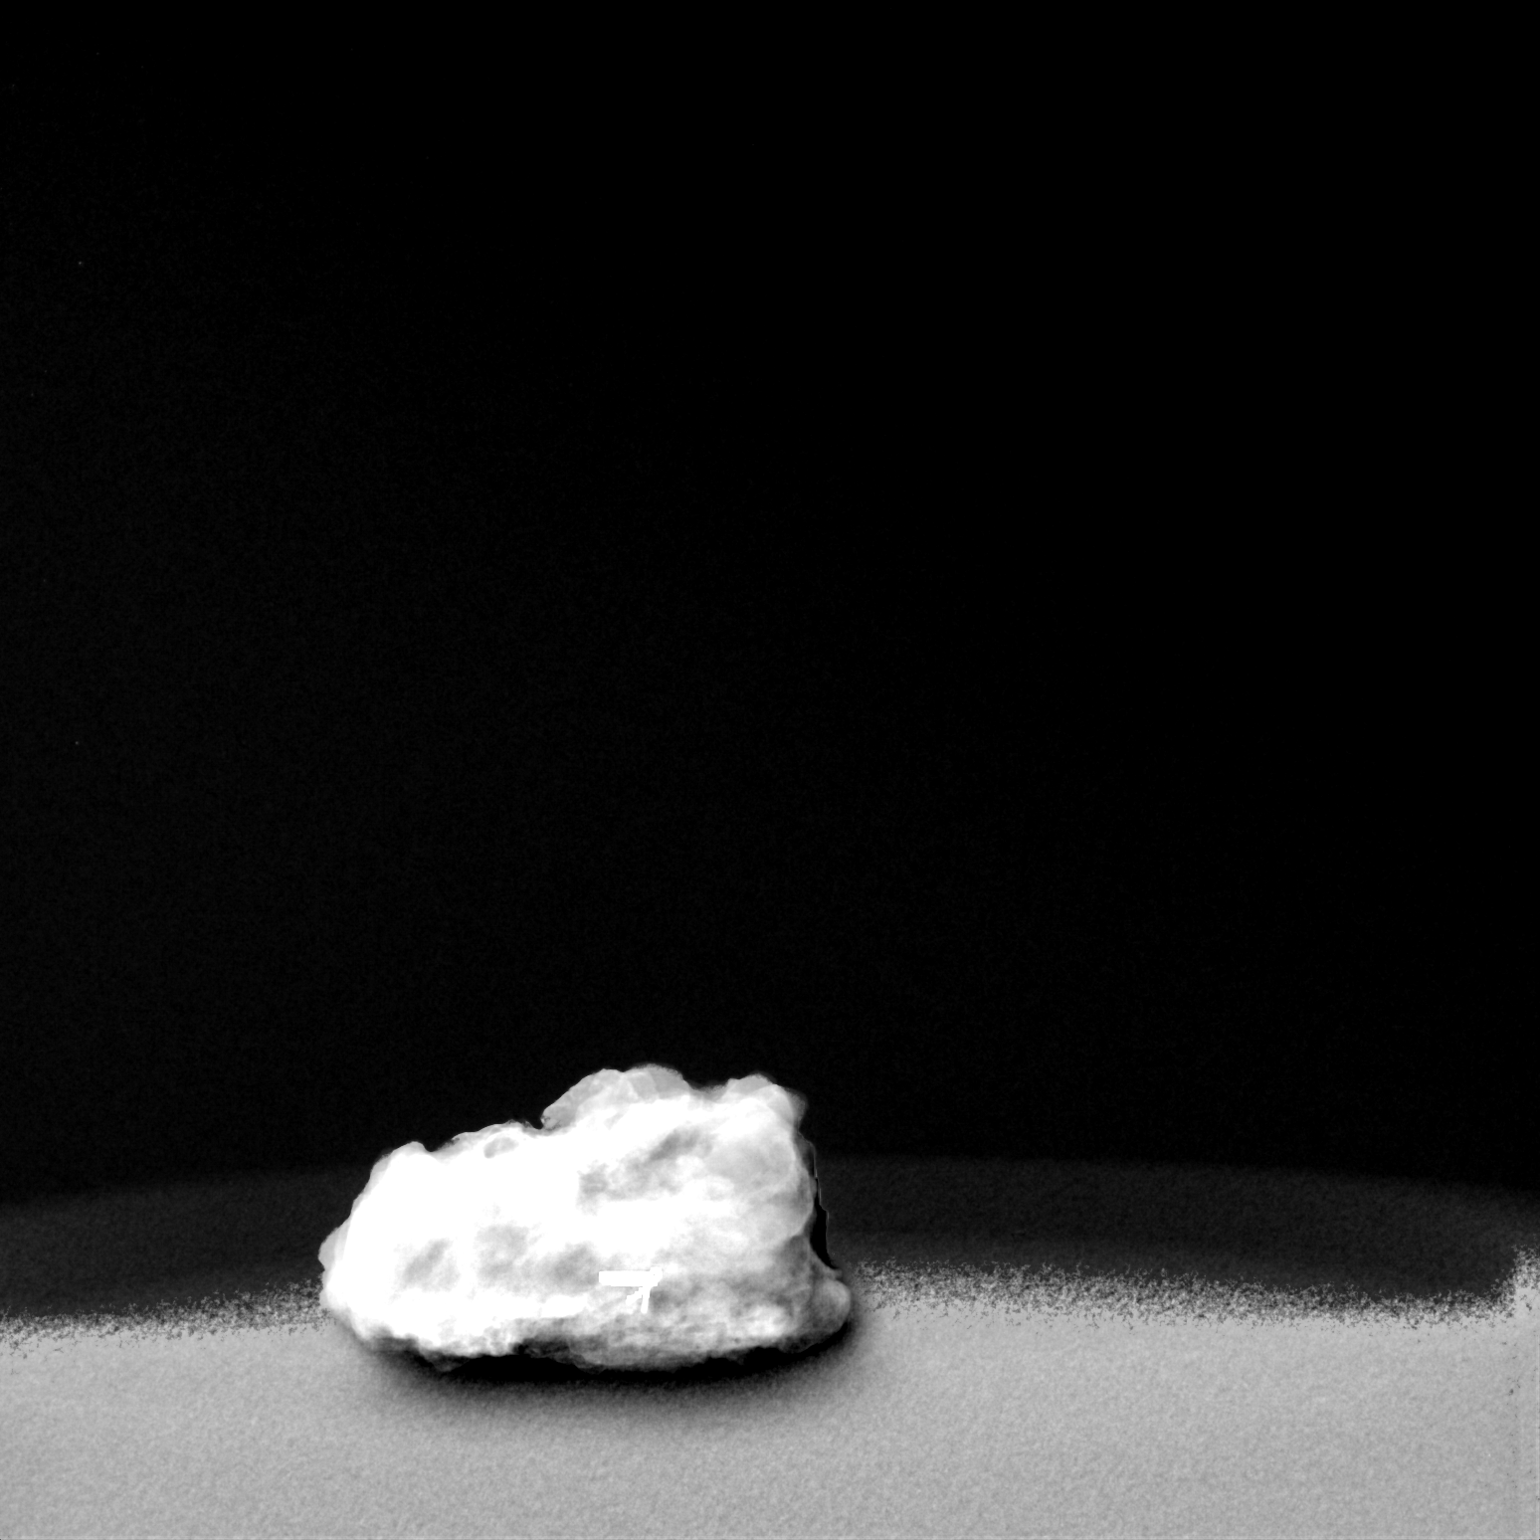

[2 of 2 positions shown; findings below may reference images not displayed]

FINDINGS: Status post excision of the right breast. The radioactive seed and
ribbon biopsy marker clip are present, completely intact, and were
marked for pathology.
IMPRESSION: Specimen radiograph of the right breast.

## 2021-04-18 ENCOUNTER — Ambulatory Visit
Admission: RE | Admit: 2021-04-18 | Discharge: 2021-04-18 | Disposition: A | Discharge status: Home / Self Care | Payer: 59 | Source: Ambulatory Visit | Attending: Obstetrics | Admitting: Obstetrics

## 2021-04-18 DIAGNOSIS — R928 Other abnormal and inconclusive findings on diagnostic imaging of breast: Secondary | ICD-10-CM

## 2021-05-02 ENCOUNTER — Other Ambulatory Visit: Payer: 59

## 2021-05-15 ENCOUNTER — Other Ambulatory Visit: Payer: Self-pay

## 2021-05-15 MED ORDER — ICOSAPENT ETHYL 1 G PO CAPS: 2.0000 g | ORAL_CAPSULE | Freq: Two times a day (BID) | ORAL | 11 refills | Status: DC

## 2021-06-24 ENCOUNTER — Telehealth: Payer: Self-pay

## 2021-06-24 NOTE — Telephone Encounter (Signed)
Received notification that patient needs Vascepa 1gm-   Plan does not cover this medications, please all [phone]. Patient ID: [id number]  Will route to Jeani Hawking, LPN to see if she can assist.   Thanks!

## 2021-06-24 NOTE — Telephone Encounter (Signed)
*  it said it was for Vascepa, sorry I mistyped*   Thank you!

## 2021-06-24 NOTE — Telephone Encounter (Signed)
I said it was for Vascepa.

## 2021-06-25 NOTE — Telephone Encounter (Deleted)
**Note De-Identified Lori Allen Obfuscation** Icosapent PA started through covermymeds. Key: [id number]

## 2021-06-25 NOTE — Telephone Encounter (Signed)
**Note De-Identified Gid Schoffstall Obfuscation** Icosapent PA started through covermymeds.  Tomasita Morrow Key: [id number] - PA Case ID: [id number] Outcome: Approved today Prior Auth;Coverage Start Date:06/25/2021;Coverage End Date:06/25/2022; Drug Icosapent Ethyl 1GM capsules Form Librarian, academic PA Form (2017 [employer])  I have notified [facility] of this approval.

## 2021-08-16 ENCOUNTER — Other Ambulatory Visit: Payer: Self-pay | Admitting: Cardiovascular Disease

## 2021-10-25 ENCOUNTER — Other Ambulatory Visit: Payer: Self-pay | Admitting: Cardiovascular Disease

## 2021-11-09 LAB — CBC
Hematocrit: 40.8 % (ref 34.0–46.6)
Hemoglobin: 13.9 g/dL (ref 11.1–15.9)
MCH: 31.2 pg (ref 26.6–33.0)
MCHC: 34.1 g/dL (ref 31.5–35.7)
MCV: 92 fL (ref 79–97)
Platelets: 222 10*3/uL (ref 150–450)
RBC: 4.45 x10E6/uL (ref 3.77–5.28)
RDW: 11.7 % (ref 11.7–15.4)
WBC: 8.2 10*3/uL (ref 3.4–10.8)

## 2021-11-09 LAB — COMPREHENSIVE METABOLIC PANEL
ALT: 36 IU/L — ABNORMAL HIGH (ref 0–32)
AST: 31 IU/L (ref 0–40)
Albumin/Globulin Ratio: 2.5 — ABNORMAL HIGH (ref 1.2–2.2)
Albumin: 4.7 g/dL (ref 3.8–4.9)
Alkaline Phosphatase: 83 IU/L (ref 44–121)
BUN/Creatinine Ratio: 10 — ABNORMAL LOW (ref 12–28)
BUN: 7 mg/dL — ABNORMAL LOW (ref 8–27)
Bilirubin Total: 0.5 mg/dL (ref 0.0–1.2)
CO2: 24 mmol/L (ref 20–29)
Calcium: 9.2 mg/dL (ref 8.7–10.3)
Chloride: 100 mmol/L (ref 96–106)
Creatinine, Ser: 0.68 mg/dL (ref 0.57–1.00)
Globulin, Total: 1.9 g/dL (ref 1.5–4.5)
Glucose: 91 mg/dL (ref 70–99)
Potassium: 4.4 mmol/L (ref 3.5–5.2)
Sodium: 140 mmol/L (ref 134–144)
Total Protein: 6.6 g/dL (ref 6.0–8.5)
eGFR: 100 mL/min/{1.73_m2} (ref >59)

## 2021-11-09 LAB — TSH: TSH: 5.26 u[IU]/mL — ABNORMAL HIGH (ref 0.450–4.500)

## 2021-11-09 LAB — LIPOPROTEIN A (LPA): Lipoprotein (a): 51.4 nmol/L (ref <75.0)

## 2021-11-10 NOTE — Progress Notes (Signed)
Cardiology Clinic Note   Patient Name: Lori Allen Date of Encounter: 11/10/2021  Primary Care Provider:  Nicoletta Dress, MD Primary Cardiologist:  None  Patient Profile    Lori Allen 61 year old female presents the clinic today for follow-up evaluation of her palpitations.  Past Medical History    Past Medical History:  Diagnosis Date   Palpitations    infrequent , occurs with stress situation    PONV (postoperative nausea and vomiting)    PVC (premature ventricular contraction)    onset 2011     Past Surgical History:  Procedure Laterality Date   BREAST BIOPSY Right 2021   BREAST EXCISIONAL BIOPSY Right 08/25/2019   BREAST LUMPECTOMY Right 2021   BREAST LUMPECTOMY WITH RADIOACTIVE SEED LOCALIZATION Right 08/26/2019   Procedure: RIGHT BREAST LUMPECTOMY WITH RADIOACTIVE SEED LOCALIZATION;  Surgeon: Jovita Kussmaul, MD;  Location: [facility];  Service: General;  Laterality: Right;   CHOLECYSTECTOMY  2012   [place] [facility] , done laparoscopically   [place] N/A 04/26/2019   Procedure: LAPAROSCOPIC ASSISTED VAGINAL HYSTERECTOMY WITH SALPINGECTOMY RIGHT OOPHORECTOMY RIGHT OVARIAN CYSTECTOMY;  Surgeon: Jerelyn Charles, MD;  Location: [facility];  Service: Gynecology;  Laterality: N/A;   LEEP  1990    Allergies  No Known Allergies  History of Present Illness    Lori Allen has a PMH of palpitations, hyperlipidemia, symptomatic PVCs, and elevated LFTs.  She was initially seen and evaluated by Dr. Geraldo Pitter.  She complained of palpitations and was placed on carvedilol.  In 2018 she began to see Dr. Otho Perl for cardiology service.  Her carvedilol was continued.  She was also taking atorvastatin and alprazolam as needed for anxiety.  She was seen by Dr. Maia Petties 11/20 for preoperative cardiac evaluation prior to gynecological surgery.  She was seen by Dr. Claiborne Billings 6/22.  She had run out of  carvedilol.  She reported occasional episodes of chest fluttering when she would lay down.  She denied exertional chest discomfort.  A cardiac event monitor and echocardiogram were ordered.  Her cardiac event monitor showed mainly normal sinus rhythm.  There were no episodes of atrial fibrillation or atrial flutter.  She was noted to have rare PACs and occasional PVCs.  Her echocardiogram showed an EF of 60-65% and G1 DD.  She was seen in follow-up by Dr. Claiborne Billings on 11/22.  During that time she reported that she had changed her diet.  She had lost approximately 13 pounds in 2 months.  Her total cholesterol had improved from 283 down to 149.  Her LDL cholesterol had improved from 171 down to 86.  She reported that she felt well.  She denies chest pain PND and orthopnea.  She was unaware of recurrent palpitations.  She had only taken her metoprolol tartrate 2 times over the past 3 months.  She was continued on Vascepa as well as ezetimibe.  Her atorvastatin 5 mg was continued.  She presents to the clinic today for follow-up evaluation and states she feels well.  She has not needed to take her metoprolol and several months.  We reviewed the importance of maintaining p.o. hydration.  She reports that her diet has not been as strict over the past few months.  We reviewed her lipoprotein a which was 51.4.  I reviewed the importance of high-fiber diet.  She expressed understanding.  She does notice occasional PVCs when she is at rest or when the weather is  hotter and she is walking.  She did also notice increased palpitations with increased stress however, she is now retired and has less stress in her life.  I will refill her metoprolol, given information for high-fiber diet and plan follow-up in 12 months.  Today she denies chest pain, shortness of breath, lower extremity edema, fatigue, palpitations, melena, hematuria, hemoptysis, diaphoresis, weakness, presyncope, syncope, orthopnea, and PND.  Palpitations,  PVCs-notes brief, rare palpitations at rest.  Previous cardiac event monitor showed rare PACs and occasional PVCs. Continue metoprolol Heart healthy low-sodium diet-salty 6 given Increase physical activity as tolerated Avoid triggers caffeine, chocolate, EtOH, dehydration etc.  Hyperlipidemia-LDL 86 on 11/22 Continue atorvastatin, ezetimibe, Vascepa Heart healthy low-sodium high-fiber diet Increase physical activity as tolerated  Elevated LFTs-AST   31 and ALT 36 on 11/08/21 Continue current medication regimen  Disposition: Follow-up with Dr. Claiborne Billings or me in 12 months.  Home Medications    Prior to Admission medications   Medication Sig Start Date End Date Taking? Authorizing Provider  ALPRAZolam (XANAX) 0.25 MG tablet Take 1 tablet (0.25 mg total) by mouth at bedtime as needed. 10/24/20   Troy Sine, MD  atorvastatin (LIPITOR) 10 MG tablet TAKE 1/2 TABLET(5 MG) BY MOUTH DAILY 10/25/21   Troy Sine, MD  dicyclomine (BENTYL) 10 MG capsule Take 10 mg by mouth every 6 (six) hours as needed. 02/27/21   [provider]  estradiol (VIVELLE-DOT) 0.075 MG/24HR 1 patch 2 (two) times a week. 09/21/20   [provider]  ezetimibe (ZETIA) 10 MG tablet Take 1 tablet (10 mg total) by mouth daily. Schedule an appointment for further refills, 1st attempt 08/16/21   Troy Sine, MD  fluticasone [facility]) 50 MCG/ACT nasal spray Place 1 spray into both nostrils daily.    [provider]  icosapent Ethyl (VASCEPA) 1 g capsule Take 2 capsules (2 g total) by mouth 2 (two) times daily. 05/15/21   Troy Sine, MD  levocetirizine (XYZAL) 5 MG tablet Take 5 mg by mouth daily as needed for allergies.     [provider]  metoprolol tartrate (LOPRESSOR) 25 MG tablet Take 1 tablet (25 mg total) by mouth as needed (For palpitations.). 10/24/20 01/22/21  Troy Sine, MD  Multiple Vitamins-Minerals (MULTIVITAMIN WITH MINERALS) tablet Take 1 tablet by mouth daily.     [provider]  Wheat Dextrin (BENEFIBER PO) Take by mouth.    [provider]    Family History    Family History  Problem Relation Age of Onset   Breast cancer Neg Hx    She indicated that the status of her neg hx is unknown.  Social History    Social History   Socioeconomic History   Marital status: Divorced    Spouse name: Not on file   Number of children: Not on file   Years of education: Not on file   Highest education level: Not on file  Occupational History   Not on file  Tobacco Use   Smoking status: Never   Smokeless tobacco: Never  Substance and Sexual Activity   Alcohol use: Yes    Comment: seldom    Drug use: Not on file   Sexual activity: Not on file  Other Topics Concern   Not on file  Social History Narrative   Not on file   Social Determinants of Health   Financial Resource Strain: Not on file  Food Insecurity: Not on file  Transportation Needs: Not on file  Physical Activity: Not on file  Stress: Not on file  Social Connections: Not on file  Intimate Partner Violence: Not on file     Review of Systems    General:  No chills, fever, night sweats or weight changes.  Cardiovascular:  No chest pain, dyspnea on exertion, edema, orthopnea, palpitations, paroxysmal nocturnal dyspnea. Dermatological: No rash, lesions/masses Respiratory: No cough, dyspnea Urologic: No hematuria, dysuria Abdominal:   No nausea, vomiting, diarrhea, bright red blood per rectum, melena, or hematemesis Neurologic:  No visual changes, wkns, changes in mental status. All other systems reviewed and are otherwise negative except as noted above.  Physical Exam    VS:  LMP  (LMP Unknown) Comment: greater than 1 year since lmp  , BMI There is no height or weight on file to calculate BMI. GEN: Well nourished, well developed, in no acute distress. HEENT: normal. Neck: Supple, no JVD, carotid bruits, or masses. Cardiac: RRR, no murmurs, rubs, or gallops.  No clubbing, cyanosis, edema.  Radials/DP/PT 2+ and equal bilaterally.  Respiratory:  Respirations regular and unlabored, clear to auscultation bilaterally. GI: Soft, nontender, nondistended, BS + x 4. MS: no deformity or atrophy. Skin: warm and dry, no rash. Neuro:  Strength and sensation are intact. Psych: Normal affect.  Accessory Clinical Findings    Recent Labs: 11/08/2021: ALT 36; BUN 7; Creatinine, Ser 0.68; Hemoglobin 13.9; Platelets 222; Potassium 4.4; Sodium 140; TSH 5.260   Recent Lipid Panel    Component Value Date/Time   CHOL 149 03/18/2021 1201   TRIG 97 03/18/2021 1201   HDL 45 03/18/2021 1201   CHOLHDL 3.3 03/18/2021 1201   LDLCALC 86 03/18/2021 1201    ECG personally reviewed by me today-sinus bradycardia 57 bpm- No acute changes  Assessment & Plan   1.  Palpitations, PVCs-notes brief, rare palpitations at rest and with increased physical activity in hot weather..  Previous cardiac event monitor showed rare PACs and occasional PVCs. Continue metoprolol as needed Heart healthy low-sodium diet-salty 6 given Increase physical activity as tolerated Avoid triggers caffeine, chocolate, EtOH, dehydration etc.  Hyperlipidemia-LDL 86 on 11/22 Continue atorvastatin, ezetimibe, Vascepa Heart healthy low-sodium high-fiber diet Increase physical activity as tolerated  Elevated LFTs-AST   31 and ALT 36 on 11/08/21 Continue current medication regimen  Disposition: Follow-up with Dr. Claiborne Billings or me in 12 months.   Jossie Ng. Maryama Kuriakose NP-C    11/10/2021, 1:20 PM [facility] [place] [address] Office [phone] Fax [phone]  Notice: This dictation was prepared with Dragon dictation along with smaller phrase technology. Any transcriptional errors that result from this process are unintentional and may not be corrected upon review.  I spent 14 minutes examining this patient, reviewing medications, and using patient centered shared  decision making involving her cardiac care.  Prior to her visit I spent greater than 20 minutes reviewing her past medical history,  medications, and prior cardiac tests.

## 2021-11-11 ENCOUNTER — Encounter: Payer: Self-pay | Admitting: General Practice

## 2021-11-11 ENCOUNTER — Ambulatory Visit (INDEPENDENT_AMBULATORY_CARE_PROVIDER_SITE_OTHER): Payer: Commercial Managed Care - HMO | Admitting: General Practice

## 2021-11-11 VITALS — BP 136/70 | HR 57 | Ht 62.0 in | Wt 130.6 lb

## 2021-11-11 DIAGNOSIS — R002 Palpitations: Secondary | ICD-10-CM | POA: Diagnosis not present

## 2021-11-11 DIAGNOSIS — I493 Ventricular premature depolarization: Secondary | ICD-10-CM | POA: Diagnosis not present

## 2021-11-11 DIAGNOSIS — E782 Mixed hyperlipidemia: Secondary | ICD-10-CM

## 2021-11-11 DIAGNOSIS — R7989 Other specified abnormal findings of blood chemistry: Secondary | ICD-10-CM

## 2021-11-11 MED ORDER — METOPROLOL TARTRATE 25 MG PO TABS: 25.0000 mg | ORAL_TABLET | ORAL | 12 refills | Status: DC | PRN

## 2021-11-11 NOTE — Patient Instructions (Addendum)
Medication Instructions:  The current medical regimen is effective;  continue present plan and medications as directed. Please refer to the Current Medication list given to you today.   *If you need a refill on your cardiac medications before your next appointment, please call your pharmacy*  Lab Work:   Testing/Procedures:  NONE    NONE If you have labs (blood work) drawn today and your tests are completely normal, you will receive your results only by: [place] (if you have MyChart) OR  A paper copy in the mail If you have any lab test that is abnormal or we need to change your treatment, we will call you to review the results.  Special Instructions INCREASE YOUR HYDRATION  PLEASE READ AND FOLLOW-INCREASED FIBER DIET-ATTACHED  Follow-Up: Your next appointment:  12 month(s) In Person with Shelva Majestic, MD  or Coletta Memos, FNP      Please call our office 2 months in advance to schedule this appointment   At [facility], you and your health needs are our priority.  As part of our continuing mission to provide you with exceptional heart care, we have created designated Provider Care Teams.  These Care Teams include your primary Cardiologist (physician) and Advanced Practice Providers (APPs -  Physician Assistants and Nurse Practitioners) who all work together to provide you with the care you need, when you need it.  Important Information About Sugar     High-Fiber Eating Plan Fiber, also called dietary fiber, is a type of carbohydrate. It is found foods such as fruits, vegetables, whole grains, and beans. A high-fiber diet can have many health benefits. Your health care provider may recommend a high-fiber diet to help: Prevent constipation. Fiber can make your bowel movements more regular. Lower your cholesterol. Relieve the following conditions: Inflammation of veins in the anus (hemorrhoids). Inflammation of specific areas of the digestive tract (uncomplicated  diverticulosis). A problem of the large intestine, also called the colon, that sometimes causes pain and diarrhea (irritable bowel syndrome, or IBS). Prevent overeating as part of a weight-loss plan. Prevent heart disease, type 2 diabetes, and certain cancers. What are tips for following this plan? Reading food labels  Check the nutrition facts label on food products for the amount of dietary fiber. Choose foods that have 5 grams of fiber or more per serving. The goals for recommended daily fiber intake include: Men (age 24 or younger): 34-38 g. Men (over age 15): 28-34 g. Women (age 64 or younger): 25-28 g. Women (over age 15): 22-25 g. Your daily fiber goal is _____________ g. Shopping Choose whole fruits and vegetables instead of processed forms, such as apple juice or applesauce. Choose a wide variety of high-fiber foods such as avocados, lentils, oats, and kidney beans. Read the nutrition facts label of the foods you choose. Be aware of foods with added fiber. These foods often have high sugar and sodium amounts per serving. Cooking Use whole-grain flour for baking and cooking. Cook with brown rice instead of white rice. Meal planning Start the day with a breakfast that is high in fiber, such as a cereal that contains 5 g of fiber or more per serving. Eat breads and cereals that are made with whole-grain flour instead of refined flour or white flour. Eat brown rice, bulgur wheat, or millet instead of white rice. Use beans in place of meat in soups, salads, and pasta dishes. Be sure that half of the grains you eat each day are whole grains. General information  You can get the recommended daily intake of dietary fiber by: Eating a variety of fruits, vegetables, grains, nuts, and beans. Taking a fiber supplement if you are not able to take in enough fiber in your diet. It is better to get fiber through food than from a supplement. Gradually increase how much fiber you consume. If you  increase your intake of dietary fiber too quickly, you may have bloating, cramping, or gas. Drink plenty of water to help you digest fiber. Choose high-fiber snacks, such as berries, raw vegetables, nuts, and popcorn. What foods should I eat? Fruits Berries. Pears. Apples. Oranges. Avocado. Prunes and raisins. Dried figs. Vegetables Sweet potatoes. Spinach. Kale. Artichokes. Cabbage. Broccoli. Cauliflower. Green peas. Carrots. Squash. Grains Whole-grain breads. Multigrain cereal. Oats and oatmeal. Brown rice. Barley. Bulgur wheat. Juana [place]. Quinoa. Bran muffins. Popcorn. Rye wafer crackers. Meats and other proteins Navy beans, kidney beans, and pinto beans. Soybeans. Split peas. Lentils. Nuts and seeds. Dairy Fiber-fortified yogurt. Beverages Fiber-fortified soy milk. Fiber-fortified orange juice. Other foods Fiber bars. The items listed above may not be a complete list of recommended foods and beverages. Contact a dietitian for more information. What foods should I avoid? Fruits Fruit juice. Cooked, strained fruit. Vegetables Fried potatoes. Canned vegetables. Well-cooked vegetables. Grains White bread. Pasta made with refined flour. White rice. Meats and other proteins Fatty cuts of meat. Fried chicken or fried fish. Dairy Milk. Yogurt. Cream cheese. Sour cream. Fats and oils Butters. Beverages Soft drinks. Other foods Cakes and pastries. The items listed above may not be a complete list of foods and beverages to avoid. Talk with your dietitian about what choices are best for you. Summary Fiber is a type of carbohydrate. It is found in foods such as fruits, vegetables, whole grains, and beans. A high-fiber diet has many benefits. It can help to prevent constipation, lower blood cholesterol, aid weight loss, and reduce your risk of heart disease, diabetes, and certain cancers. Increase your intake of fiber gradually. Increasing fiber too quickly may cause cramping, bloating,  and gas. Drink plenty of water while you increase the amount of fiber you consume. The best sources of fiber include whole fruits and vegetables, whole grains, nuts, seeds, and beans. This information is not intended to replace advice given to you by your health care provider. Make sure you discuss any questions you have with your health care provider. Document Revised: 09/01/2019 Document Reviewed: 09/01/2019 Elsevier Patient Education  [place].

## 2021-11-26 ENCOUNTER — Other Ambulatory Visit: Payer: Self-pay | Admitting: Cardiovascular Disease

## 2022-01-03 ENCOUNTER — Other Ambulatory Visit: Payer: Self-pay | Admitting: Cardiovascular Disease

## 2022-09-25 ENCOUNTER — Other Ambulatory Visit (HOSPITAL_COMMUNITY): Payer: Self-pay

## 2022-09-25 ENCOUNTER — Telehealth: Payer: Self-pay

## 2022-09-25 NOTE — Telephone Encounter (Signed)
Pharmacy Patient Advocate Encounter   Received notification from [facility] that prior authorization for Icosapent Ethyl 1GM capsules is required/requested.   PA submitted on 5.16.24 to (ins) [employer] via CoverMyMeds Key or (Medicaid) confirmation # [id number]  Status is pending

## 2022-09-26 NOTE — Telephone Encounter (Signed)
Pharmacy Patient Advocate Encounter  Received notification from [employer] that the request for prior authorization for VASCEPA has been denied due to PLAN CRITERIA NOT MET   PLEASE ADVISE  Haze Rushing, CPhT Pharmacy Patient Advocate Specialist Direct Number: [phone] Fax: [phone]

## 2022-10-15 NOTE — Telephone Encounter (Signed)
I have not seen her since 2022.  If unable to fill Vascepa, can try lovaza 1000 mg bid

## 2022-10-22 MED ORDER — OMEGA-3-ACID ETHYL ESTERS 1 G PO CAPS: 2.0000 g | ORAL_CAPSULE | Freq: Two times a day (BID) | ORAL | 3 refills | Status: DC

## 2022-10-22 NOTE — Telephone Encounter (Signed)
Left message for pt that insurance will not cover Vascepa without hx of ASCVD, I sent in rx for Lovaza instead.

## 2023-01-02 ENCOUNTER — Other Ambulatory Visit: Payer: Self-pay | Admitting: Cardiovascular Disease

## 2023-01-02 ENCOUNTER — Other Ambulatory Visit: Payer: Self-pay | Admitting: General Practice

## 2023-03-02 ENCOUNTER — Other Ambulatory Visit: Payer: Self-pay | Admitting: Cardiovascular Disease

## 2023-04-08 ENCOUNTER — Other Ambulatory Visit: Payer: Self-pay | Admitting: General Practice

## 2023-05-08 ENCOUNTER — Other Ambulatory Visit: Payer: Self-pay | Admitting: General Practice

## 2023-06-08 ENCOUNTER — Other Ambulatory Visit: Payer: Self-pay | Admitting: General Practice

## 2023-06-30 ENCOUNTER — Other Ambulatory Visit: Payer: Self-pay | Admitting: Cardiovascular Disease

## 2023-11-17 ENCOUNTER — Other Ambulatory Visit: Payer: Self-pay | Admitting: Cardiovascular Disease

## 2023-11-17 DIAGNOSIS — E782 Mixed hyperlipidemia: Secondary | ICD-10-CM

## 2023-11-19 NOTE — Telephone Encounter (Signed)
 Former Pt of Dr. Burnard. Last OV was 11/11/21. Past her 3rd attempt. Does any provider want to refill? Please advise.

## 2024-02-15 ENCOUNTER — Ambulatory Visit: Admitting: Cardiovascular Disease

## 2024-02-15 ENCOUNTER — Ambulatory Visit: Attending: Cardiology | Admitting: Cardiology

## 2024-02-15 VITALS — BP 140/70 | HR 55 | Ht 62.0 in | Wt 137.0 lb

## 2024-02-15 DIAGNOSIS — I493 Ventricular premature depolarization: Secondary | ICD-10-CM | POA: Diagnosis not present

## 2024-02-15 DIAGNOSIS — E782 Mixed hyperlipidemia: Secondary | ICD-10-CM | POA: Diagnosis not present

## 2024-02-15 DIAGNOSIS — Z79899 Other long term (current) drug therapy: Secondary | ICD-10-CM | POA: Diagnosis not present

## 2024-02-15 MED ORDER — METOPROLOL TARTRATE 25 MG PO TABS: 25.0000 mg | ORAL_TABLET | ORAL | 0 refills | Status: DC | PRN

## 2024-02-15 NOTE — Progress Notes (Signed)
 Cardiology Office Note:  .   ID:  Lori Allen, DOB January 05, 1961, MRN [id number] PCP:  Keren Vicenta BRAVO, MD  Former Cardiology Providers: Dr. Edwyna, Dr. Debby Sor [facility] Providers Cardiologist:  Madonna Large, DO , [facility] (established care 02/15/2024) Electrophysiologist:  None  Click to update primary MD,subspecialty MD or APP then REFRESH:1}    No chief complaint on file.   History of Present Illness: .   Lori Allen is a 63 y.o. Caucasian female whose past medical history and cardiovascular risk factors includes: PVCs, hyperlipidemia, hypertriglyceridemia.  Formally under the care of Dr. Sor who last saw Lori Allen back in 03/2021. I am seeing her for the first time to re-establishing care.   Patient was being followed in the practice given her history of palpitations and PVCs.  She has been on pharmacological therapy and has done well.  She was last seen in the office on November 11, 2021 progress note reviewed as part of today's visit.  Palpitation: Well-controlled. Occurs occasionally. More noticeable at bedtime or under stress Was given Lopressor  to use on appearing basis but has not required it.  Hyperlipidemia: Was prescribed Lipitor 10 mg p.o. daily, was taking 5 mg p.o. nightly. She is on omega-3 supplements as well. And her whole replacement therapy was reduced secondary to her lipid profile. Her last lipids were checked in 2023-which noted elevated levels despite dietary changes and weight loss.  No structured exercise program or daily routine.   Review of Systems: .   Review of Systems  Cardiovascular:  Negative for chest pain, claudication, irregular heartbeat, leg swelling, near-syncope, orthopnea, palpitations, paroxysmal nocturnal dyspnea and syncope.  Respiratory:  Negative for shortness of breath.   Hematologic/Lymphatic: Negative for bleeding problem.    Studies Reviewed:   EKG: EKG Interpretation Date/Time:  Monday February 15 2024  15:08:46 EDT Ventricular Rate:  55 PR Interval:  134 QRS Duration:  102 QT Interval:  436 QTC Calculation: 417 R Axis:   23  Text Interpretation: Sinus bradycardia When compared with ECG of 22-Apr-2019 14:23, Premature ventricular complexes are no longer Present Nonspecific T wave abnormality no longer evident in Inferior leads Nonspecific T wave abnormality, improved in Anterolateral leads QT has lengthened Confirmed by Large Madonna [phone]) on 02/15/2024 3:53:13 PM  Echocardiogram: July 2022: LVEF 60 to 65%. Grade 1 diastolic dysfunction. Right ventricular size and function normal. No significant valvular heart disease. Estimated RAP 3 mmHg  Cardiac monitor: 2022: Average 60bpm No episodes of atrial fibrillation/flutter. No pauses. PAC burden <1%. PVC burden 2.38% per report.  RADIOLOGY: NA  Risk Assessment/Calculations:   NA   Labs:       Latest Ref Rng & Units 11/08/2021   11:42 AM 10/25/2020   10:42 AM 04/27/2019    4:31 AM  CBC  WBC 3.4 - 10.8 x10E3/uL 8.2  7.7  11.4   Hemoglobin 11.1 - 15.9 g/dL 86.0  85.6  87.9   Hematocrit 34.0 - 46.6 % 40.8  42.2  37.3   Platelets 150 - 450 x10E3/uL 222  238  196        Latest Ref Rng & Units 02/18/2024    1:12 PM 11/08/2021   11:42 AM 03/18/2021   12:01 PM  BMP  Glucose 70 - 99 mg/dL 95  91  96   BUN 8 - 27 mg/dL 9  7  10    Creatinine 0.57 - 1.00 mg/dL 9.29  9.31  9.36   BUN/Creat Ratio 12 -  28 13  10  16    Sodium 134 - 144 mmol/L 142  140  144   Potassium 3.5 - 5.2 mmol/L 4.0  4.4  4.2   Chloride 96 - 106 mmol/L 101  100  105   CO2 20 - 29 mmol/L 21  24  22    Calcium  8.7 - 10.3 mg/dL 9.3  9.2  9.1       Latest Ref Rng & Units 02/18/2024    1:12 PM 11/08/2021   11:42 AM 03/18/2021   12:01 PM  CMP  Glucose 70 - 99 mg/dL 95  91  96   BUN 8 - 27 mg/dL 9  7  10    Creatinine 0.57 - 1.00 mg/dL 9.29  9.31  9.36   Sodium 134 - 144 mmol/L 142  140  144   Potassium 3.5 - 5.2 mmol/L 4.0  4.4  4.2   Chloride 96 - 106  mmol/L 101  100  105   CO2 20 - 29 mmol/L 21  24  22    Calcium  8.7 - 10.3 mg/dL 9.3  9.2  9.1   Total Protein 6.0 - 8.5 g/dL 7.2  6.6  6.7   Total Bilirubin 0.0 - 1.2 mg/dL 0.6  0.5  1.0   Alkaline Phos 49 - 135 IU/L 82  83  89   AST 0 - 40 IU/L 31  31  45   ALT 0 - 32 IU/L 36  36  57     Lab Results  Component Value Date   CHOL 292 (H) 02/18/2024   HDL 50 02/18/2024   LDLCALC 214 (H) 02/18/2024   TRIG 148 02/18/2024   CHOLHDL 5.8 (H) 02/18/2024   Recent Labs    02/18/24 1312  LIPOA 50.1   No components found for: NTPROBNP No results for input(s): PROBNP in the last 8760 hours. No results for input(s): TSH in the last 8760 hours.  Physical Exam:    Today's Vitals   02/15/24 1503  BP: (!) 140/70  Pulse: (!) 55  SpO2: 97%  Weight: 137 lb (62.1 kg)  Height: 5' 2 (1.575 m)   Body mass index is 25.06 kg/m. Wt Readings from Last 3 Encounters:  02/15/24 137 lb (62.1 kg)  11/11/21 130 lb 9.6 oz (59.2 kg)  03/20/21 144 lb (65.3 kg)    Physical Exam  Constitutional: No distress.  hemodynamically stable  Neck: No JVD present.  Cardiovascular: Normal rate, regular rhythm, S1 normal and S2 normal. Exam reveals no gallop, no S3 and no S4.  No murmur heard. Pulmonary/Chest: Effort normal and breath sounds normal. No stridor. She has no wheezes. She has no rales.  Musculoskeletal:        General: No edema.     Cervical back: Neck supple.  Skin: Skin is warm.     Impression & Recommendation(s):  Impression:   ICD-10-CM   1. PVC (premature ventricular contraction)  I49.3 EKG 12-Lead    Comprehensive metabolic panel with GFR    2. Mixed hyperlipidemia  E78.2 Lipid panel    Comprehensive metabolic panel with GFR    Lipoprotein A (LPA)    CT CARDIAC SCORING (SELF PAY ONLY)    3. Medication management  Z79.899 Lipid panel    Comprehensive metabolic panel with GFR    Lipoprotein A (LPA)       Recommendation(s):  PVC (premature ventricular contraction) PVCs  and palpitations have significantly improved. Uses Lopressor  on as needed basis. EKG shows sinus bradycardia  without ectopy.  Mixed hyperlipidemia Elevated cholesterol, not on statin for over a year. Discussed cholesterol monitoring, potential medication need, and calcium  score test for coronary risk assessment. Explained calcium  score implications and medication risks/benefits. - Order fasting lipid panel and liver function tests. - Order LP(a) test. - Recommend calcium  score test. - Discuss potential need for cholesterol medication based on test results. - Advise follow-up with primary care for liver function concerns. Of note the labs were reported prior to the completion of the progress note.  Refer to the result note for recommendations  Known history of mild transaminitis in the past, advised her to follow-up with either PCP or GI for further evaluation and management.  Orders Placed:  Orders Placed This Encounter  Procedures   CT CARDIAC SCORING (SELF PAY ONLY)    Standing Status:   Future    Number of Occurrences:   1    Expected Date:   02/16/2024    [facility]    Radiology Contrast Protocol - do NOT remove file path:   [email or web address]   Lipid panel    Standing Status:   Future    Expected Date:   02/22/2024    Expiration Date:   02/14/2025   Comprehensive metabolic panel with GFR    Standing Status:   Future    Expected Date:   02/22/2024    Expiration Date:   02/14/2025   Lipoprotein A (LPA)    Standing Status:   Future    Expected Date:   02/22/2024    Expiration Date:   02/14/2025   EKG 12-Lead     Final Medication List:    Meds ordered this encounter  Medications   DISCONTD: metoprolol  tartrate (LOPRESSOR ) 25 MG tablet    Sig: Take 1 tablet (25 mg total) by mouth as needed (for palpitations).    Dispense:  30 tablet    Refill:  0    Medications Discontinued During This  Encounter  Medication Reason   metoprolol  tartrate (LOPRESSOR ) 25 MG tablet Reorder   dicyclomine (BENTYL) 10 MG capsule Patient Preference   ezetimibe  (ZETIA ) 10 MG tablet Patient Preference     Current Outpatient Medications:    estradiol (VIVELLE-DOT) 0.075 MG/24HR, 1 patch 2 (two) times a week., Disp: , Rfl:    fluticasone (FLONASE) 50 MCG/ACT nasal spray, Place 1 spray into both nostrils daily., Disp: , Rfl:    Multiple Vitamins-Minerals (MULTIVITAMIN WITH MINERALS) tablet, Take 1 tablet by mouth daily., Disp: , Rfl:    omega-3 acid ethyl esters (LOVAZA ) 1 g capsule, TAKE 2 CAPSULES(2G TOTAL) BY MOUTH 2(TWO) TIMES DAILY, Disp: 360 capsule, Rfl: 3   Wheat Dextrin (BENEFIBER PO), Take by mouth., Disp: , Rfl:    atorvastatin  (LIPITOR) 10 MG tablet, TAKE 1 TABLET(10 MG) BY MOUTH DAILY (Patient not taking: Reported on 02/15/2024), Disp: 45 tablet, Rfl: 0   metoprolol  tartrate (LOPRESSOR ) 25 MG tablet, Take 1 tablet (25 mg total) by mouth as needed (for palpitations)., Disp: 30 tablet, Rfl: 11  Consent:   NA  Disposition:   1 year follow-up sooner if needed  Her questions and concerns were addressed to her satisfaction. She voices understanding of the recommendations provided during this encounter.    Signed, Madonna Michele HAS, [facility]  A Division of [facility] [address] [place], [place] [address]

## 2024-02-15 NOTE — Patient Instructions (Addendum)
 Medication Instructions:  The current medical regimen is effective;  continue present plan and medications.  *If you need a refill on your cardiac medications before your next appointment, please call your pharmacy*  Lab Work: Please have fasting blood work Lipid, CMP, LPa.  If you have labs (blood work) drawn today and your tests are completely normal, you will receive your results only by: MyChart Message (if you have MyChart) OR A paper copy in the mail If you have any lab test that is abnormal or we need to change your treatment, we will call you to review the results.  Follow up with primary care doctor for management of liver functions/lipid.  Testing: Your physician has requested that you have a Coronary Calcium  score which is completed by CT. Cardiac computed tomography (CT) is a painless test that uses an x-ray machine to take clear, detailed pictures of your heart. There are no instructions for this testing.  You may eat/drink and take your normal medications this day.  The cost of the testing is $99 due at the time of your appointment.  Follow-Up: At [facility], you and your health needs are our priority.  As part of our continuing mission to provide you with exceptional heart care, our providers are all part of one team.  This team includes your primary Cardiologist (physician) and Advanced Practice Providers or APPs (Physician Assistants and Nurse Practitioners) who all work together to provide you with the care you need, when you need it.  Your next appointment:   1 year(s)  Provider:   Dr Michele     We recommend signing up for the patient portal called MyChart.  Sign up information is provided on this After Visit Summary.  MyChart is used to connect with patients for Virtual Visits (Telemedicine).  Patients are able to view lab/test results, encounter notes, upcoming appointments, etc.  Non-urgent messages can be sent to your provider as well.   To learn more  about what you can do with MyChart, go to [email or web address].

## 2024-02-16 ENCOUNTER — Other Ambulatory Visit: Payer: Self-pay

## 2024-02-18 ENCOUNTER — Other Ambulatory Visit: Payer: Self-pay | Admitting: Cardiology

## 2024-02-18 ENCOUNTER — Ambulatory Visit (HOSPITAL_COMMUNITY)
Admission: RE | Admit: 2024-02-18 | Discharge: 2024-02-18 | Disposition: A | Discharge status: Home / Self Care | Payer: Self-pay | Source: Ambulatory Visit | Attending: Cardiology | Admitting: Cardiology

## 2024-02-18 DIAGNOSIS — E782 Mixed hyperlipidemia: Secondary | ICD-10-CM | POA: Insufficient documentation

## 2024-02-18 MED ORDER — METOPROLOL TARTRATE 25 MG PO TABS: 25.0000 mg | ORAL_TABLET | ORAL | 11 refills | Status: AC | PRN

## 2024-02-19 LAB — LIPID PANEL
Chol/HDL Ratio: 5.8 ratio — ABNORMAL HIGH (ref 0.0–4.4)
Cholesterol, Total: 292 mg/dL — ABNORMAL HIGH (ref 100–199)
HDL: 50 mg/dL (ref >39)
LDL Chol Calc (NIH): 214 mg/dL — ABNORMAL HIGH (ref 0–99)
Triglycerides: 148 mg/dL (ref 0–149)
VLDL Cholesterol Cal: 28 mg/dL (ref 5–40)

## 2024-02-19 LAB — COMPREHENSIVE METABOLIC PANEL WITH GFR
ALT: 36 IU/L — ABNORMAL HIGH (ref 0–32)
AST: 31 IU/L (ref 0–40)
Albumin: 5 g/dL — ABNORMAL HIGH (ref 3.9–4.9)
Alkaline Phosphatase: 82 IU/L (ref 49–135)
BUN/Creatinine Ratio: 13 (ref 12–28)
BUN: 9 mg/dL (ref 8–27)
Bilirubin Total: 0.6 mg/dL (ref 0.0–1.2)
CO2: 21 mmol/L (ref 20–29)
Calcium: 9.3 mg/dL (ref 8.7–10.3)
Chloride: 101 mmol/L (ref 96–106)
Creatinine, Ser: 0.7 mg/dL (ref 0.57–1.00)
Globulin, Total: 2.2 g/dL (ref 1.5–4.5)
Glucose: 95 mg/dL (ref 70–99)
Potassium: 4 mmol/L (ref 3.5–5.2)
Sodium: 142 mmol/L (ref 134–144)
Total Protein: 7.2 g/dL (ref 6.0–8.5)
eGFR: 97 mL/min/1.73 (ref >59)

## 2024-02-19 LAB — LIPOPROTEIN A (LPA): Lipoprotein (a): 50.1 nmol/L (ref <75.0)

## 2024-02-21 ENCOUNTER — Encounter: Payer: Self-pay | Admitting: Cardiology

## 2024-02-21 ENCOUNTER — Ambulatory Visit: Payer: Self-pay | Admitting: Cardiology

## 2024-02-21 DIAGNOSIS — E782 Mixed hyperlipidemia: Secondary | ICD-10-CM

## 2024-02-26 MED ORDER — ROSUVASTATIN CALCIUM 10 MG PO TABS: 10.0000 mg | ORAL_TABLET | Freq: Every day | ORAL | 3 refills | Status: AC

## 2024-02-26 NOTE — Telephone Encounter (Signed)
 Pt aware of CT calcium  score.  Reviewed in mychart.  Aware overread not completed as of this time.

## 2024-02-26 NOTE — Telephone Encounter (Signed)
 Pt requesting a c/b to discuss results.

## 2024-04-21 LAB — LIPID PANEL
Chol/HDL Ratio: 2.4 ratio (ref 0.0–4.4)
Cholesterol, Total: 122 mg/dL (ref 100–199)
HDL: 51 mg/dL (ref >39)
LDL Chol Calc (NIH): 54 mg/dL (ref 0–99)
Triglycerides: 88 mg/dL (ref 0–149)
VLDL Cholesterol Cal: 17 mg/dL (ref 5–40)

## 2024-04-21 LAB — COMPREHENSIVE METABOLIC PANEL WITH GFR
ALT: 28 IU/L (ref 0–32)
AST: 34 IU/L (ref 0–40)
Albumin: 5.1 g/dL — ABNORMAL HIGH (ref 3.9–4.9)
Alkaline Phosphatase: 78 IU/L (ref 49–135)
BUN/Creatinine Ratio: 13 (ref 12–28)
BUN: 9 mg/dL (ref 8–27)
Bilirubin Total: 1 mg/dL (ref 0.0–1.2)
CO2: 23 mmol/L (ref 20–29)
Calcium: 9.4 mg/dL (ref 8.7–10.3)
Chloride: 99 mmol/L (ref 96–106)
Creatinine, Ser: 0.68 mg/dL (ref 0.57–1.00)
Globulin, Total: 1.5 g/dL (ref 1.5–4.5)
Glucose: 94 mg/dL (ref 70–99)
Potassium: 3.8 mmol/L (ref 3.5–5.2)
Sodium: 138 mmol/L (ref 134–144)
Total Protein: 6.6 g/dL (ref 6.0–8.5)
eGFR: 98 mL/min/1.73 (ref >59)
